# Patient Record
Sex: Female | Born: 1935 | Race: Black or African American | Hispanic: No | Marital: Married | Smoking: Never smoker
Health system: Southern US, Community
[De-identification: ages and names within clinical notes are randomized; demographics above are authoritative.]

## PROBLEM LIST (undated history)

## (undated) DIAGNOSIS — I10 Essential (primary) hypertension: Secondary | ICD-10-CM

## (undated) DIAGNOSIS — I671 Cerebral aneurysm, nonruptured: Secondary | ICD-10-CM

## (undated) DIAGNOSIS — E7800 Pure hypercholesterolemia, unspecified: Secondary | ICD-10-CM

## (undated) DIAGNOSIS — I422 Other hypertrophic cardiomyopathy: Secondary | ICD-10-CM

## (undated) DIAGNOSIS — I480 Paroxysmal atrial fibrillation: Principal | ICD-10-CM

## (undated) DIAGNOSIS — H269 Unspecified cataract: Secondary | ICD-10-CM

## (undated) DIAGNOSIS — M1990 Unspecified osteoarthritis, unspecified site: Secondary | ICD-10-CM

## (undated) DIAGNOSIS — I351 Nonrheumatic aortic (valve) insufficiency: Secondary | ICD-10-CM

## (undated) HISTORY — DX: Pure hypercholesterolemia, unspecified: E78.00

## (undated) HISTORY — DX: Unspecified osteoarthritis, unspecified site: M19.90

## (undated) HISTORY — DX: Essential (primary) hypertension: I10

## (undated) HISTORY — DX: Cerebral aneurysm, nonruptured: I67.1

## (undated) HISTORY — DX: Other hypertrophic cardiomyopathy: I42.2

## (undated) HISTORY — DX: Unspecified cataract: H26.9

## (undated) HISTORY — DX: Nonrheumatic aortic (valve) insufficiency: I35.1

## (undated) HISTORY — DX: Paroxysmal atrial fibrillation: I48.0

---

## 1999-01-02 ENCOUNTER — Other Ambulatory Visit: Admission: RE | Admit: 1999-01-02 | Discharge: 1999-01-02 | Payer: Self-pay | Admitting: Internal Medicine

## 1999-02-16 ENCOUNTER — Ambulatory Visit (HOSPITAL_COMMUNITY): Admission: RE | Admit: 1999-02-16 | Discharge: 1999-02-16 | Payer: Self-pay | Admitting: Internal Medicine

## 1999-02-21 ENCOUNTER — Encounter: Admission: RE | Admit: 1999-02-21 | Discharge: 1999-02-21 | Payer: Self-pay | Admitting: Internal Medicine

## 1999-02-21 ENCOUNTER — Encounter: Payer: Self-pay | Admitting: Internal Medicine

## 2000-02-23 ENCOUNTER — Encounter: Payer: Self-pay | Admitting: Internal Medicine

## 2000-02-23 ENCOUNTER — Encounter: Admission: RE | Admit: 2000-02-23 | Discharge: 2000-02-23 | Payer: Self-pay | Admitting: Internal Medicine

## 2001-03-07 ENCOUNTER — Encounter: Admission: RE | Admit: 2001-03-07 | Discharge: 2001-03-07 | Payer: Self-pay | Admitting: Internal Medicine

## 2001-03-07 ENCOUNTER — Encounter: Payer: Self-pay | Admitting: Internal Medicine

## 2001-12-10 ENCOUNTER — Encounter: Payer: Self-pay | Admitting: Internal Medicine

## 2001-12-10 ENCOUNTER — Encounter: Admission: RE | Admit: 2001-12-10 | Discharge: 2001-12-10 | Payer: Self-pay | Admitting: Internal Medicine

## 2002-03-17 ENCOUNTER — Encounter: Admission: RE | Admit: 2002-03-17 | Discharge: 2002-03-17 | Payer: Self-pay | Admitting: Internal Medicine

## 2002-03-17 ENCOUNTER — Encounter: Payer: Self-pay | Admitting: Internal Medicine

## 2003-03-23 ENCOUNTER — Encounter: Admission: RE | Admit: 2003-03-23 | Discharge: 2003-03-23 | Payer: Self-pay | Admitting: Internal Medicine

## 2004-03-16 ENCOUNTER — Other Ambulatory Visit: Admission: RE | Admit: 2004-03-16 | Discharge: 2004-03-16 | Payer: Self-pay | Admitting: Family Medicine

## 2004-08-14 ENCOUNTER — Encounter: Admission: RE | Admit: 2004-08-14 | Discharge: 2004-08-14 | Payer: Self-pay | Admitting: Family Medicine

## 2005-07-25 ENCOUNTER — Encounter: Admission: RE | Admit: 2005-07-25 | Discharge: 2005-07-25 | Payer: Self-pay | Admitting: Family Medicine

## 2005-08-31 ENCOUNTER — Ambulatory Visit (HOSPITAL_COMMUNITY): Admission: RE | Admit: 2005-08-31 | Discharge: 2005-08-31 | Payer: Self-pay | Admitting: Family Medicine

## 2005-10-22 ENCOUNTER — Encounter (INDEPENDENT_AMBULATORY_CARE_PROVIDER_SITE_OTHER): Payer: Self-pay | Admitting: Specialist

## 2005-10-22 ENCOUNTER — Ambulatory Visit (HOSPITAL_COMMUNITY): Admission: RE | Admit: 2005-10-22 | Discharge: 2005-10-22 | Payer: Self-pay | Admitting: Gastroenterology

## 2006-08-30 ENCOUNTER — Encounter: Admission: RE | Admit: 2006-08-30 | Discharge: 2006-08-30 | Payer: Self-pay | Admitting: Internal Medicine

## 2007-09-12 ENCOUNTER — Encounter: Admission: RE | Admit: 2007-09-12 | Discharge: 2007-09-12 | Payer: Self-pay | Admitting: Internal Medicine

## 2009-03-10 ENCOUNTER — Encounter: Admission: RE | Admit: 2009-03-10 | Discharge: 2009-03-10 | Payer: Self-pay | Admitting: Internal Medicine

## 2010-03-22 ENCOUNTER — Other Ambulatory Visit: Payer: Self-pay | Admitting: Internal Medicine

## 2010-03-22 DIAGNOSIS — Z1231 Encounter for screening mammogram for malignant neoplasm of breast: Secondary | ICD-10-CM

## 2010-03-28 ENCOUNTER — Ambulatory Visit
Admission: RE | Admit: 2010-03-28 | Discharge: 2010-03-28 | Disposition: A | Discharge status: Home / Self Care | Payer: Medicare Other | Source: Ambulatory Visit | Attending: Internal Medicine | Admitting: Internal Medicine

## 2010-03-28 DIAGNOSIS — Z1231 Encounter for screening mammogram for malignant neoplasm of breast: Secondary | ICD-10-CM

## 2010-04-03 ENCOUNTER — Other Ambulatory Visit: Payer: Self-pay | Admitting: Internal Medicine

## 2010-04-03 DIAGNOSIS — R928 Other abnormal and inconclusive findings on diagnostic imaging of breast: Secondary | ICD-10-CM

## 2010-04-10 ENCOUNTER — Ambulatory Visit
Admission: RE | Admit: 2010-04-10 | Discharge: 2010-04-10 | Disposition: A | Discharge status: Home / Self Care | Payer: Medicare Other | Source: Ambulatory Visit | Attending: Internal Medicine | Admitting: Internal Medicine

## 2010-04-10 DIAGNOSIS — R928 Other abnormal and inconclusive findings on diagnostic imaging of breast: Secondary | ICD-10-CM

## 2010-06-30 NOTE — Op Note (Signed)
NAME:  Courtney Reyes, Courtney Reyes                ACCOUNT NO.:  [id number]   MEDICAL RECORD NO.:  [id number]          PATIENT TYPE:  AMB   LOCATION:  ENDO                         FACILITY:  [facility]   PHYSICIAN:  Anselmo Rod, M.D.  DATE OF BIRTH:  1924/04/30   DATE OF PROCEDURE:  10/22/2005  DATE OF DISCHARGE:                                 OPERATIVE REPORT   PROCEDURE:  Colonoscopy with snare polypectomy x1, cold biopsies x2.   ENDOSCOPIST:  Anselmo Rod, M.D.   INSTRUMENT USED:  Olympus videocolonoscope.   INDICATIONS FOR PROCEDURE:  The patient is a 77 year old African American  female undergoing screening colonoscopy to rule out colonic polyps, masses,  etc.   PREPROCEDURE PREPARATION:  Informed consent was procured from the patient.  The patient was fasted for four hours prior to the procedure and prepped  with 20 OsmoPrep pills the night of and 12 OsmoPrep pills the morning of the  procedure.  The risks and benefits of the procedure, including a 10% missed  rate of cancer and polyps were discussed with her as well.   PREPROCEDURE PHYSICAL EXAMINATION:  VITAL SIGNS:  Stable.  NECK:  Supple.  CHEST:  Clear to auscultation.  S1 and S2 regular.  ABDOMEN:  Soft with normal bowel sounds.   DESCRIPTION OF PROCEDURE:  The patient was placed in the left lateral  decubitus position and sedated with 40 mcg of Fentanyl and 4 mg of Versed in  slow incremental doses given intravenously.  Once the patient was adequately  sedated and maintained on low-flow oxygen and continuous cardiac monitoring,  the Olympus videocolonoscope was advanced from the rectum to the cecum.  There was some residual stool in the colon.  Multiple washings were done.  A  small sessile polyp was removed by core biopsy (cold biopsies x2) from the  proximal right colon.  Another flat polyp was removed by hot snare from 65  cm.  A few sigmoid diverticula were noted.  Internal hemorrhoids were  appreciated on retroflexion in  the rectum.  The terminal ileum appeared  healthy and without lesions.  The patient tolerated the procedure well  without immediate complications.   IMPRESSION:  1. Small nonbleeding internal hemorrhoids.  2. Sigmoid diverticulosis.  3. One small flat polyp removed by hot snare from 65 cm.  4. Another sessile polyp removed by cold biopsy from the proximal right      colon.  5. Normal terminal ileum.   RECOMMENDATIONS:  1. Await pathology results.  2. Avoid all nonsteroidals, including aspirin, for the next two weeks.  3. Brochures on diverticulosis and high fiber diet have been given to the      patient for education.  4. Outpatient followup as need arises in the future.      Anselmo Rod, M.D.  Electronically Signed     JNM/MEDQ  D:  10/22/2005  T:  10/22/2005  Job:  [id number]   cc:   Renaye Rakers, M.D.

## 2011-05-07 ENCOUNTER — Other Ambulatory Visit: Payer: Self-pay | Admitting: Internal Medicine

## 2011-05-07 DIAGNOSIS — Z1231 Encounter for screening mammogram for malignant neoplasm of breast: Secondary | ICD-10-CM

## 2011-05-22 ENCOUNTER — Ambulatory Visit
Admission: RE | Admit: 2011-05-22 | Discharge: 2011-05-22 | Disposition: A | Discharge status: Home / Self Care | Payer: Medicare Other | Source: Ambulatory Visit | Attending: Internal Medicine | Admitting: Internal Medicine

## 2011-05-22 DIAGNOSIS — Z1231 Encounter for screening mammogram for malignant neoplasm of breast: Secondary | ICD-10-CM

## 2015-01-19 ENCOUNTER — Other Ambulatory Visit (HOSPITAL_COMMUNITY): Payer: Self-pay | Admitting: Internal Medicine

## 2015-01-19 DIAGNOSIS — I499 Cardiac arrhythmia, unspecified: Secondary | ICD-10-CM

## 2015-02-02 ENCOUNTER — Other Ambulatory Visit: Payer: Self-pay

## 2015-02-02 ENCOUNTER — Ambulatory Visit (HOSPITAL_COMMUNITY): Payer: Medicare Other | Attending: Cardiology

## 2015-02-02 DIAGNOSIS — I10 Essential (primary) hypertension: Secondary | ICD-10-CM | POA: Diagnosis not present

## 2015-02-02 DIAGNOSIS — I499 Cardiac arrhythmia, unspecified: Secondary | ICD-10-CM | POA: Insufficient documentation

## 2015-03-03 ENCOUNTER — Encounter: Payer: Self-pay | Admitting: Interventional Cardiology

## 2015-03-15 DIAGNOSIS — R001 Bradycardia, unspecified: Secondary | ICD-10-CM | POA: Insufficient documentation

## 2015-03-16 ENCOUNTER — Ambulatory Visit (INDEPENDENT_AMBULATORY_CARE_PROVIDER_SITE_OTHER): Payer: Medicare Other | Admitting: Interventional Cardiology

## 2015-03-16 ENCOUNTER — Encounter: Payer: Self-pay | Admitting: Interventional Cardiology

## 2015-03-16 DIAGNOSIS — I422 Other hypertrophic cardiomyopathy: Secondary | ICD-10-CM | POA: Diagnosis not present

## 2015-03-16 DIAGNOSIS — I480 Paroxysmal atrial fibrillation: Secondary | ICD-10-CM | POA: Diagnosis not present

## 2015-03-16 DIAGNOSIS — I351 Nonrheumatic aortic (valve) insufficiency: Secondary | ICD-10-CM | POA: Diagnosis not present

## 2015-03-16 HISTORY — DX: Nonrheumatic aortic (valve) insufficiency: I35.1

## 2015-03-16 HISTORY — DX: Other hypertrophic cardiomyopathy: I42.2

## 2015-03-16 NOTE — Progress Notes (Signed)
Cardiology Office Note   Date:  03/16/2015   ID:  Courtney Reyes, DOB Feb 26, 1924, MRN [id number]  PCP:  Katy Apo, MD  Cardiologist:  Lesleigh Noe, MD   Chief Complaint  Patient presents with  . Bradycardia      History of Present Illness: Courtney Reyes is a 82 y.o. female who presents for evaluation of paroxysmal atrial fibrillation (EKG diagnosis), severe aortic regurgitation, an apical hypertrophic cardiomyopathy.  Courtney Reyes is a very pleasant 82 year old female whom I have known for quite some time. I was her husband's cardiologist. She is now referred by Dr. polite because of concern about atrial fibrillation noted on EKG in December 2016. The patient was asymptomatic. She remains asymptomatic. An echocardiogram was performed and demonstrated apical hypertrophic cardiomyopathy as well as moderate to severe aortic regurgitation. She denies chest pain, orthopnea, edema, syncope, palpitations, transient neurological symptoms, and history of heart disease. He is accompanied by her daughter who helps with history.    Past Medical History  Diagnosis Date  . Hypertension   . Hypercholesteremia   . Osteoarthritis   . Cataracts, bilateral   . Cerebral aneurysm     No past surgical history on file.   Current Outpatient Prescriptions  Medication Sig Dispense Refill  . aspirin 325 MG tablet Take 325 mg by mouth daily.    Marland Kitchen atenolol (TENORMIN) 100 MG tablet Take 100 mg by mouth daily.    . calcium-vitamin D (OSCAL 500/200 D-3) 500-200 MG-UNIT tablet Take 1 tablet by mouth daily with breakfast.    . chlorthalidone (HYGROTON) 25 MG tablet Take 25 mg by mouth daily as needed (elevated BP or swelling).     . dorzolamide-timolol (COSOPT) 22.3-6.8 MG/ML ophthalmic solution Place 1 drop into both eyes 2 (two) times daily.  11  . latanoprost (XALATAN) 0.005 % ophthalmic solution Place 1 drop into both eyes daily.  11  . meloxicam (MOBIC) 7.5 MG tablet Take 7.5 mg by mouth  daily.    . potassium chloride (K-DUR) 10 MEQ tablet Take 10 mEq by mouth 2 (two) times daily.    . Red Yeast Rice 600 MG CAPS Take 600 mg by mouth daily.     No current facility-administered medications for this visit.    Allergies:   Aleve    Social History:  The patient  reports that she has never smoked. She has never used smokeless tobacco. She reports that she does not drink alcohol or use illicit drugs.   Family History:  The patient's family history includes Healthy in her father and mother.    ROS:  Please see the history of present illness.   Otherwise, review of systems are positive for leg pain, skipped heartbeats, irregular heartbeat, vision disturbance, back pain, difficulty with balance..   All other systems are reviewed and negative.    PHYSICAL EXAM: VS:  BP 140/76 mmHg  Pulse 50  Ht  (1.6 m)  Wt 127 lb 12.8 oz (57.97 kg)  BMI 22.64 kg/m2 , BMI Body mass index is 22.64 kg/(m^2). GEN: Well nourished, well developed, in no acute distress HEENT: normal Neck: no JVD, carotid bruits, or masses Cardiac: RRR.  There is a grade 2 to 3/6 decrescendo high-pitched holodiastolic murmur of aortic regurgitation.  There is no rub but an S4 gallop. There is trace bilateral edema. Respiratory:  clear to auscultation bilaterally, normal work of breathing. GI: soft, nontender, nondistended, + BS MS: no deformity or atrophy Skin: warm and dry,  no rash Neuro:  Strength and sensation are intact Psych: euthymic mood, full affect   EKG:  EKG is ordered today. The ekg reveals sinus bradycardia 50 bpm, first-degree AV block, left anterior hemiblock, left ventricular hypertrophy, marked precordial and inferolateral T-wave inversion.   Recent Labs: No results found for requested labs within last 365 days.    Lipid Panel No results found for: CHOL, TRIG, HDL, CHOLHDL, VLDL, LDLCALC, LDLDIRECT    Wt Readings from Last 3 Encounters:  03/16/15 127 lb 12.8 oz (57.97 kg)       Other studies Reviewed: Additional studies/ records that were reviewed today include: Reviewed records from [facility] physicians and Dr. Renford Dills. The findings include a chart diagnosis of atrial fibrillation but we do not have the EKG. Have also reviewed the echocardiogram performed at [facility] which demonstrates apical hypertrophy    ASSESSMENT AND PLAN:  1. PAF(paroxysmal atrial fibrillation) Based upon records from Dr. polite  2. Aortic regurgitation, severe by echo Based upon recent echo performed here. She is asymptomatic and denies dyspnea. Echo does not reveal any evidence of LV enlargement.  3. Apical variant hypertrophic cardiomyopathy (HCC), documented by echo 2017 Based upon recent echo performed here, asymptomatic.    Current medicines are reviewed at length with the patient today.  The patient has the following concerns regarding medicines: none.  The following changes/actions have been instituted:    Confirm atrial fibrillation diagnosis by obtaining the EKG from [facility]  Thirty-day continuous ambulatory monitor We discussed the pros and cons of anticoagulation therapy.This patients CHA2DS2-VASc Score and unadjusted Ischemic Stroke Rate (% per year) is equal to 4.8 % stroke rate/year from a score of 4 Above score calculated as 1 point each if present [CHF, HTN, DM, Vascular=MI/PAD/Aortic Plaque, Age if 65-74, or Female] Above score calculated as 2 points each if present [Age > 75, or Stroke/TIA/TE] Further discussion after more information is available.  Labs/ tests ordered today include:  No orders of the defined types were placed in this encounter.     Disposition:   FU with HS in 1 month if we confirm atrial fibrillation he is on monitor or EKG from [place], Lesleigh Noe, MD  03/16/2015 3:12 PM    [facility] [address] [place], [place], [place]  [address] Phone: [phone]; Fax: [phone]

## 2015-03-16 NOTE — Patient Instructions (Signed)
Medication Instructions:  Your physician recommends that you continue on your current medications as directed. Please refer to the Current Medication list given to you today.   Labwork: None ordered  Testing/Procedures: Your physician has recommended that you wear an event monitor. Event monitors are medical devices that record the heart's electrical activity. Doctors most often [place] these monitors to diagnose arrhythmias. Arrhythmias are problems with the speed or rhythm of the heartbeat. The monitor is a small, portable device. You can wear one while you do your normal daily activities. This is usually used to diagnose what is causing palpitations/syncope (passing out).    Follow-Up: Your physician recommends that you schedule a follow-up appointment as needed   Any Other Special Instructions Will Be Listed Below (If Applicable).     If you need a refill on your cardiac medications before your next appointment, please call your pharmacy.

## 2015-03-17 ENCOUNTER — Ambulatory Visit (INDEPENDENT_AMBULATORY_CARE_PROVIDER_SITE_OTHER): Payer: Medicare Other

## 2015-03-17 DIAGNOSIS — I480 Paroxysmal atrial fibrillation: Secondary | ICD-10-CM | POA: Diagnosis not present

## 2015-03-18 ENCOUNTER — Telehealth: Payer: Self-pay | Admitting: *Deleted

## 2015-03-18 NOTE — Telephone Encounter (Signed)
On Call Cardiology   Jamie from [employer] called [phone]. Apparently patient had a post-conversion pause of 3.2 sec (from AF to NSR). After conversion, initial sinus rate was 45 bpm. Patient was sleeping at the time and no symptoms reported.   Given her high CHA2DS2-VASc score (at least 4 =  age above 18, gender, HTN), oral anticoagulation would be recommended if no contraindications.   Will forward this message to Dr. Verdis Prime.   Nevin Bloodgood, MD

## 2015-03-18 NOTE — Telephone Encounter (Signed)
Pt in a fib on monitor HR around 100 - we have not been able to contact.  But do you want to add anticoagulation? Which.  thanks

## 2015-03-18 NOTE — Telephone Encounter (Signed)
Received [employer] monitor alert for patient who had monitor applied yesterday. Episode is from 03/17/15 at 7:11pm HR of 100 BPM, prelim findings of "atrial flutter, wide complex beats"; call attempt to patient unsuccessful per report.  Called and left voice mail at this time for patient to call back. Reviewed with [employer], Nada Boozer, who will review with Dr. Katrinka Blazing

## 2015-03-19 NOTE — Telephone Encounter (Signed)
Please notify patient that we have discovered atrial fibrillation on the monitor. Will need a follow up OV to discuss management going forward. Needs Echo to assess LV if we have not already scheduled.

## 2015-03-21 NOTE — Telephone Encounter (Signed)
Spoke with pt and her daughter. Scheduled pt to see Robbie Lis, PA-C on 2/8 at 11AM. Informed Nada Boozer, NP that pt had echo on 12/21. She said no need to repeat at this time.

## 2015-03-21 NOTE — Telephone Encounter (Signed)
Left message to call back  

## 2015-03-23 ENCOUNTER — Encounter: Payer: Self-pay | Admitting: Cardiology

## 2015-03-23 ENCOUNTER — Ambulatory Visit (INDEPENDENT_AMBULATORY_CARE_PROVIDER_SITE_OTHER): Payer: Medicare Other | Admitting: Cardiology

## 2015-03-23 VITALS — BP 130/70 | HR 53 | Ht 63.0 in | Wt 129.8 lb

## 2015-03-23 DIAGNOSIS — I422 Other hypertrophic cardiomyopathy: Secondary | ICD-10-CM

## 2015-03-23 DIAGNOSIS — I351 Nonrheumatic aortic (valve) insufficiency: Secondary | ICD-10-CM

## 2015-03-23 DIAGNOSIS — I480 Paroxysmal atrial fibrillation: Secondary | ICD-10-CM | POA: Insufficient documentation

## 2015-03-23 HISTORY — DX: Paroxysmal atrial fibrillation: I48.0

## 2015-03-23 LAB — CBC WITH DIFFERENTIAL/PLATELET
BASOS ABS: 0 10*3/uL (ref 0.0–0.1)
Basophils Relative: 0 % (ref 0–1)
EOS ABS: 0.1 10*3/uL (ref 0.0–0.7)
Eosinophils Relative: 1 % (ref 0–5)
HEMATOCRIT: 42 % (ref 36.0–46.0)
HEMOGLOBIN: 13.7 g/dL (ref 12.0–15.0)
LYMPHS ABS: 2.5 10*3/uL (ref 0.7–4.0)
LYMPHS PCT: 46 % (ref 12–46)
MCH: 31.6 pg (ref 26.0–34.0)
MCHC: 32.6 g/dL (ref 30.0–36.0)
MCV: 97 fL (ref 78.0–100.0)
MONOS PCT: 7 % (ref 3–12)
MPV: 10.1 fL (ref 8.6–12.4)
Monocytes Absolute: 0.4 10*3/uL (ref 0.1–1.0)
NEUTROS ABS: 2.5 10*3/uL (ref 1.7–7.7)
NEUTROS PCT: 46 % (ref 43–77)
PLATELETS: 223 10*3/uL (ref 150–400)
RBC: 4.33 MIL/uL (ref 3.87–5.11)
RDW: 13.9 % (ref 11.5–15.5)
WBC: 5.5 10*3/uL (ref 4.0–10.5)

## 2015-03-23 LAB — BASIC METABOLIC PANEL
BUN: 22 mg/dL (ref 7–25)
CALCIUM: 9.8 mg/dL (ref 8.6–10.4)
CHLORIDE: 103 mmol/L (ref 98–110)
CO2: 27 mmol/L (ref 20–31)
CREATININE: 1.09 mg/dL — AB (ref 0.60–0.88)
Glucose, Bld: 102 mg/dL — ABNORMAL HIGH (ref 65–99)
Potassium: 4.3 mmol/L (ref 3.5–5.3)
Sodium: 140 mmol/L (ref 135–146)

## 2015-03-23 LAB — TSH: TSH: 1.34 mIU/L

## 2015-03-23 MED ORDER — ACETAMINOPHEN ER 650 MG PO TBCR: 650.0000 mg | EXTENDED_RELEASE_TABLET | Freq: Three times a day (TID) | ORAL | Status: AC | PRN

## 2015-03-23 MED ORDER — APIXABAN 2.5 MG PO TABS: 2.5000 mg | ORAL_TABLET | Freq: Two times a day (BID) | ORAL | Status: DC

## 2015-03-23 NOTE — Patient Instructions (Addendum)
Medication Instructions:  Your physician has recommended you make the following change in your medication:  1.  STOP the Aspirin 2.  START the Eliquis 2.5 mg taking 1 tablet twice a day 3.  STOP the Meloxicam 4.  START the Tylenol 650 mg taking 1 tablet daily as needed for pain   Labwork: TODAY:  CBC W/DIFF                TSH                BMET  Testing/Procedures: None ordered  Follow-Up: Your physician recommends that you schedule a follow-up appointment WITH DR. Katrinka Blazing AT HIS 1ST AVAILABLE    Any Other Special Instructions Will Be Listed Below (If Applicable).   If you need a refill on your cardiac medications before your next appointment, please call your pharmacy.

## 2015-03-23 NOTE — Progress Notes (Signed)
03/23/2015 Courtney Reyes   1924-06-20  [id number]  Primary Physician Katy Apo, MD Primary Cardiologist: Dr. Katrinka Blazing   Reason for Visit/CC: F/u for PAF  HPI:  82 y/o female, with h/o HTN, HLD, who was recently referred to Dr. Katrinka Blazing, by her PCP, given concerns for PAF. Patient was seen by Dr. Katrinka Blazing on 03/16/15 and he ordered a 30 day cardiac monitor to assess for atrial fibrillation to help determine if she would need anticoagulation to reduce stroke risk. Patient was fitted with monitor and was noted to be in atrial fibrillation on 03/18/15. She was observed to be in a RVR in the 150s. She was notified by the monitoring station. Her daugher checked her pulse at home and noted it was irregular and fast. The patient denies any symptoms at all during the time. Her daughter gave her an additional atenolol tablet and her pulse rate improved. The patient was instructed to f/u in clinic for further assessment and to discuss anticoagulation options. It should also be noted that Dr. Katrinka Blazing ordred an Echo which showed normal LVEF but did demonstrate apical hypertrophic cardiomyopathy as well as moderate to severe aortic regurgitation. Given her lack of symptoms, Dr. Katrinka Blazing noted that there is no need for intervention at this time.   She presents back to clinic today for f/u. She is accompanied by her daughter. EKG today now shows sinus bradycardia with a rate of 53 bpm. She reports that she feels fine. She denies any symptoms of fatigue. No dizziness, syncope/ near syncope. She has been completely asymptomatic regarding her arrhthymia. She denies palpitations, dyspnea, exertional dyspnea and chest pain. Her BP is well controlled. Her CHA2DS2 VASc score is 4 for Age >19, female sex and HTN.  She denies prior h/o Stroke/TIA. No h/o abnormal bleeding of falls.      Current Outpatient Prescriptions  Medication Sig Dispense Refill  . aspirin 325 MG tablet Take 325 mg by mouth daily.    Marland Kitchen atenolol (TENORMIN) 100  MG tablet Take 100 mg by mouth daily.    . calcium-vitamin D (OSCAL 500/200 D-3) 500-200 MG-UNIT tablet Take 1 tablet by mouth daily with breakfast.    . chlorthalidone (HYGROTON) 25 MG tablet Take 25 mg by mouth daily as needed (elevated BP or swelling).     . dorzolamide-timolol (COSOPT) 22.3-6.8 MG/ML ophthalmic solution Place 1 drop into both eyes 2 (two) times daily.  11  . latanoprost (XALATAN) 0.005 % ophthalmic solution Place 1 drop into both eyes daily.  11  . meloxicam (MOBIC) 7.5 MG tablet Take 7.5 mg by mouth daily.    . potassium chloride (K-DUR) 10 MEQ tablet Take 10 mEq by mouth 2 (two) times daily.    . Red Yeast Rice 600 MG CAPS Take 600 mg by mouth daily.     No current facility-administered medications for this visit.    Allergies  Allergen Reactions  . Aleve [Naproxen Sodium] Hives    Social History   Social History  . Marital Status: Married    Spouse Name: N/A  . Number of Children: N/A  . Years of Education: N/A   Occupational History  . Not on file.   Social History Main Topics  . Smoking status: Never Smoker   . Smokeless tobacco: Never Used  . Alcohol Use: No  . Drug Use: No  . Sexual Activity: Not on file   Other Topics Concern  . Not on file   Social History Narrative  Review of Systems: General: negative for chills, fever, night sweats or weight changes.  Cardiovascular: negative for chest pain, dyspnea on exertion, edema, orthopnea, palpitations, paroxysmal nocturnal dyspnea or shortness of breath Dermatological: negative for rash Respiratory: negative for cough or wheezing Urologic: negative for hematuria Abdominal: negative for nausea, vomiting, diarrhea, bright red blood per rectum, melena, or hematemesis Neurologic: negative for visual changes, syncope, or dizziness All other systems reviewed and are otherwise negative except as noted above.    Blood pressure 130/70, pulse 53, height  (1.6 m), weight 129 lb 12.8 oz (58.877  kg).  General appearance: alert, cooperative and no distress Neck: no carotid bruit and no JVD Lungs: clear to auscultation bilaterally Heart: regular rate and rhythm and 2/6 DM Extremities: no LEE Pulses: 2+ and symmetric Skin: warm and dry Neurologic: Grossly normal  EKG sinus bradycardia 53 bpm   ASSESSMENT AND PLAN:  1. PAF: out patient cardiac monitoring confirmed the diagnosis of PAF. EKG today shows that she is back in NSR. HR is well controlled on atenolol. BP is stable. She has been completely asymptomatic with her afib episdodes. Recent 2D echo showed normal LV systolic function but apical hypertrophic cardiomyopathy as well as moderate to severe aortic regurgitation. No intrevention indicated given she is asymtomatic. Her CHA2DS2 VASc score is 4 for Age >83, female sex and HTN. We discussed the pros and cons of anticoagulation therapy.This patients CHA2DS2-VASc Score and unadjusted Ischemic Stroke Rate (% per year) is equal to 4.8 % stroke rate/year from a score of 4 Above score calculated as 1 point each if present [CHF, HTN, DM, Vascular=MI/PAD/Aortic Plaque, Age if 65-74, or Female] Above score calculated as 2 points each if present [Age > 75, or Stroke/TIA/TE].  I have also discussed with Dr. Katrinka Blazing who has recommended anticoagulation for stroke prophylasixs. The patient has decided to start anticoagulation. We will initiate 2.5 mg of Eliquis BID. We will check a CBC and BMP for baseline H/H and renal function. Patient instructed to stop ASA (no h/o CAD). She was also advised to stop daily use of Meloxicam to reduce risk for GIB. Daily Tylenol recommended for her chronic LBP. 30 day free Eliqius card was given.      PLAN  F/u with Dr. Katrinka Blazing in 6-8 weeks for reassessment.   Robbie Lis PA-C 03/23/2015 11:24 AM

## 2015-03-30 ENCOUNTER — Telehealth: Payer: Self-pay | Admitting: Interventional Cardiology

## 2015-03-30 NOTE — Telephone Encounter (Signed)
Returned pt daughter [name]'scall. Pt was started on Eliqyis for new onset afib She sts that pt monthly supply of Eliquis would cost over $200 a month. Pt was given a card for a free 30day supply of Eliquis. Pt pharmacy will be fwd a tier exception form to be completed by our office. [name] sts that pt is doing ok, pt has a f/u appt with Dr.Smith in April. [name] to call the office if pt develops symptoms, has bleeding, or falls. [name] that I will leave 2 wks worth of samples at the front desk for her to pick up. [name] voiced appreciation for the call back and verbalized understanding.

## 2015-03-30 NOTE — Telephone Encounter (Signed)
New message ° ° ° ° ° °Talk to the nurse.  She would not tell me what she wanted °

## 2015-04-05 ENCOUNTER — Ambulatory Visit: Payer: Medicare Other | Admitting: Cardiology

## 2015-04-07 ENCOUNTER — Telehealth: Payer: Self-pay | Admitting: Interventional Cardiology

## 2015-04-07 NOTE — Telephone Encounter (Signed)
Patient calling the office for samples of medication:   1.  What medication and dosage are you requesting samples for? Eliquis   2.  Are you currently out of this medication? No     

## 2015-04-07 NOTE — Telephone Encounter (Signed)
Called patient to let her know that we had samples available for pick up.

## 2015-04-08 ENCOUNTER — Telehealth: Payer: Self-pay | Admitting: Interventional Cardiology

## 2015-04-08 NOTE — Telephone Encounter (Signed)
Follow up      Calling to see what the doctor said about her "fuzzy" vision

## 2015-04-08 NOTE — Telephone Encounter (Signed)
New message      Pt c/o medication issue:  1. Name of Medication:  eliquis 2. How are you currently taking this medication (dosage and times per day)?  2.5mg  daily 3. Are you having a reaction (difficulty breathing--STAT)? no  4. What is your medication issue?  Pt states her vision is "fuzzy".  Pt started medication on 03-23-15.  For a few days she took it twice a day, then started taking it once a day because she would forget to take it twice.  Could this be the reason her vision is fuzzy.

## 2015-04-08 NOTE — Telephone Encounter (Signed)
dont think fuzzy vision should be an issue

## 2015-04-08 NOTE — Telephone Encounter (Signed)
I don't think there is a correlation between vision and Eliquis. Needs to see Ophthalmologist.

## 2015-04-08 NOTE — Telephone Encounter (Signed)
**Note De-Identified Earle Troiano Obfuscation** The pts daughter is advised of Dr Ricki Miller response. I also advised her to make sure that the pt is taking her Eliquis BID as directed and to contact the pts PCP and/or eye MD if "fuzzy" vision does not improve over the weekend . Also I discussed the s/s to look for for Stroke prevention and to call 911 if the pt exhibits any. She verbalized understanding to all instructions given and thanked me for my assistance.

## 2015-04-08 NOTE — Telephone Encounter (Signed)
Routed original message to triage

## 2015-04-08 NOTE — Telephone Encounter (Signed)
**Note De-Identified Gay Rape Obfuscation** The pt is also advised of Dr Lonn Georgia recommendation. She verbalized understanding and thanked me for calling her back with Dr Lonn Georgia response as she states that the pt will feel better knowing that Dr Katrinka Blazing is aware.

## 2015-04-08 NOTE — Telephone Encounter (Signed)
See attached message:  . Name of Medication: eliquis 2. How are you currently taking this medication (dosage and times per day)? 2.5mg  daily 3. Are you having a reaction (difficulty breathing--STAT)? no  4. What is your medication issue? Pt states her vision is "fuzzy". Pt started medication on 03-23-15. For a few days she took it twice a day, then started taking it once a day because she would forget to take it twice. Could this be the reason her vision is fuzzy  Daughter states that she doesn't know her BP or HR She does not have issues with diabetes or blood sugar issues No weakness of arms or legs Has glaucoma but she feels the fuzzy vision is worse than with her glaucoma  Routed to Audrie Lia and Margaretmary Dys, pharmacist Routing to Dr. Katrinka Blazing Routing to DOD Dr. Eden Emms

## 2015-04-27 ENCOUNTER — Telehealth: Payer: Self-pay

## 2015-04-27 NOTE — Telephone Encounter (Signed)
Prior auth for Eliquis 2.5mg submitted to [facility]. 

## 2015-04-29 ENCOUNTER — Telehealth: Payer: Self-pay

## 2015-04-29 NOTE — Telephone Encounter (Signed)
Eliquis approved through 02/12/2016. PA- [id number].

## 2015-05-04 ENCOUNTER — Telehealth: Payer: Self-pay

## 2015-05-04 NOTE — Telephone Encounter (Signed)
Called to give pt her cardiac event monitor results. lmtcb

## 2015-05-04 NOTE — Telephone Encounter (Signed)
Follow up ° ° ° ° ° °Returning a call to the nurse °

## 2015-05-05 ENCOUNTER — Telehealth: Payer: Self-pay | Admitting: Interventional Cardiology

## 2015-05-05 NOTE — Telephone Encounter (Signed)
Pt daughter Fulton Molelice aware of pt's monitor results with verbal understanding. Frequent atrial fib and atrial flutter with occasional SB and post conversion pauses. 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 This could eventually require pacemaker. Clinical observation for now. If any dizzy or syncopal episodes, please report. Pt will f/u as planned with Dr.Smith in April 2017

## 2015-05-05 NOTE — Telephone Encounter (Signed)
Follow up    Daughter calling forgot to ask about the eliquis.

## 2015-05-05 NOTE — Telephone Encounter (Signed)
Follow up ° ° ° ° ° °Returning a call to the nurse °

## 2015-05-05 NOTE — Telephone Encounter (Signed)
Returned pt daughter [name]'s call. She has a question about the pt Eliquis tier exception she will call back to speak with Linda,LPN. [facility]

## 2015-05-05 NOTE — Telephone Encounter (Signed)
New message      Calling to get eliquis reduced to a tier 2 drug.  Please call when you return

## 2015-05-09 NOTE — Telephone Encounter (Signed)
Tier exception for Eliquis 2.5mg  [facility] to [facility].

## 2015-05-09 NOTE — Telephone Encounter (Signed)
Spoke with Fulton MoleAlice, patient's daughter. Have let her know we requested a tier exception today. Should know in a day or 2.

## 2015-05-24 ENCOUNTER — Telehealth: Payer: Self-pay

## 2015-05-24 NOTE — Telephone Encounter (Signed)
Tier exception for Eliquis is denied. Patient will  receive a letter to this affect.

## 2015-06-10 ENCOUNTER — Encounter: Payer: Self-pay | Admitting: Interventional Cardiology

## 2015-06-10 ENCOUNTER — Ambulatory Visit (INDEPENDENT_AMBULATORY_CARE_PROVIDER_SITE_OTHER): Payer: Medicare Other | Admitting: Interventional Cardiology

## 2015-06-10 VITALS — BP 146/94 | HR 88 | Ht 63.0 in | Wt 123.4 lb

## 2015-06-10 DIAGNOSIS — I351 Nonrheumatic aortic (valve) insufficiency: Secondary | ICD-10-CM

## 2015-06-10 DIAGNOSIS — Z7901 Long term (current) use of anticoagulants: Secondary | ICD-10-CM | POA: Diagnosis not present

## 2015-06-10 DIAGNOSIS — I422 Other hypertrophic cardiomyopathy: Secondary | ICD-10-CM | POA: Diagnosis not present

## 2015-06-10 DIAGNOSIS — I480 Paroxysmal atrial fibrillation: Secondary | ICD-10-CM

## 2015-06-10 NOTE — Patient Instructions (Signed)
Medication Instructions:  Your physician recommends that you continue on your current medications as directed. Please refer to the Current Medication list given to you today.  Courtney Reyes- Courtney Reyes will work on looking into Eliquis medication assistance. She should call you.  Labwork: None ordered  Testing/Procedures: None ordered  Follow-Up: Your physician wants you to follow-up in: 6 months with Dr. Katrinka BlazingSmith. You will receive a reminder letter in the mail two months in advance. If you don't receive a letter, please call our office to schedule the follow-up appointment.  If you need a refill on your cardiac medications before your next appointment, please call your pharmacy.  Thank you for choosing [facility]!!

## 2015-06-10 NOTE — Progress Notes (Signed)
Cardiology Office Note   Date:  06/10/2015   ID:  Courtney Reyes, DOB 03/30/1924, MRN [id number]  PCP:  Katy Apo, MD  Cardiologist:  Lesleigh Noe, MD   Chief Complaint  Patient presents with  . Atrial Fibrillation      History of Present Illness: Courtney Reyes is a 82 y.o. female who presents for Paroxysmal atrial arrhythmia, hypertension, hypertrophic cardiomyopathy, and aortic regurgitation.  No bleeding on anti-coagulation therapy. Unable to afford the medication and has therefore been taking one tablet per day instead of the prescribed to times per day apixaban 2.5 mg tablets. No bleeding or neurological complaints. She has not had syncope. She denies dyspnea.    Past Medical History  Diagnosis Date  . Hypertension   . Hypercholesteremia   . Osteoarthritis   . Cataracts, bilateral   . Cerebral aneurysm   . Aortic regurgitation 03/16/2015  . Apical variant hypertrophic cardiomyopathy (HCC) 03/16/2015  . PAF (paroxysmal atrial fibrillation) (HCC) 03/23/2015    No past surgical history on file.   Current Outpatient Prescriptions  Medication Sig Dispense Refill  . acetaminophen (TYLENOL 8 HOUR) 650 MG CR tablet Take 1 tablet (650 mg total) by mouth every 8 (eight) hours as needed for pain. 30 tablet 2  . apixaban (ELIQUIS) 2.5 MG TABS tablet Take 1 tablet (2.5 mg total) by mouth 2 (two) times daily. 180 tablet 1  . aspirin 325 MG tablet Take 325 mg by mouth daily.    Marland Kitchen atenolol (TENORMIN) 100 MG tablet Take 100 mg by mouth daily.    . calcium-vitamin D (OSCAL 500/200 D-3) 500-200 MG-UNIT tablet Take 1 tablet by mouth daily with breakfast.    . chlorthalidone (HYGROTON) 25 MG tablet Take 25 mg by mouth daily as needed (elevated BP or swelling).     . dorzolamide-timolol (COSOPT) 22.3-6.8 MG/ML ophthalmic solution Place 1 drop into both eyes 2 (two) times daily.  11  . latanoprost (XALATAN) 0.005 % ophthalmic solution Place 1 drop into both eyes daily.  11  .  potassium chloride (K-DUR) 10 MEQ tablet Take 10 mEq by mouth 2 (two) times daily.    . Red Yeast Rice 600 MG CAPS Take 600 mg by mouth daily.     No current facility-administered medications for this visit.    Allergies:   Aleve    Social History:  The patient  reports that she has never smoked. She has never used smokeless tobacco. She reports that she does not drink alcohol or use illicit drugs.   Family History:  The patient's family history includes Healthy in her father and mother.    ROS:  Please see the history of present illness.   Otherwise, review of systems are positive for insomnia.   All other systems are reviewed and negative.    PHYSICAL EXAM: VS:  BP 146/94 mmHg  Pulse 88  Ht  (1.6 m)  Wt 123 lb 6.4 oz (55.974 kg)  BMI 21.86 kg/m2 , BMI Body mass index is 21.86 kg/(m^2). GEN: Well nourished, well developed, in no acute distress HEENT: normal Neck: no JVD, carotid bruits, or masses Cardiac: RRR.  There is  no murmur, rub, or gallop. There is no edema. Respiratory:  clear to auscultation bilaterally, normal work of breathing. GI: soft, nontender, nondistended, + BS MS: no deformity or atrophy Skin: warm and dry, no rash Neuro:  Strength and sensation are intact Psych: euthymic mood, full affect   EKG:  EKG is  not ordered today.   Recent Labs: 03/23/2015: BUN 22; Creat 1.09*; Hemoglobin 13.7; Platelets 223; Potassium 4.3; Sodium 140; TSH 1.34    Lipid Panel No results found for: CHOL, TRIG, HDL, CHOLHDL, VLDL, LDLCALC, LDLDIRECT    Wt Readings from Last 3 Encounters:  06/10/15 123 lb 6.4 oz (55.974 kg)  03/23/15 129 lb 12.8 oz (58.877 kg)  03/16/15 127 lb 12.8 oz (57.97 kg)      Other studies Reviewed: Additional studies/ records that were reviewed today include: None. The findings include none.    ASSESSMENT AND PLAN:  1. PAF (paroxysmal atrial fibrillation) (HCC) Asymptomatic currently in a regular rhythm at 80 bpm  2. Aortic  regurgitation Mild by clinical exam  3. Apical variant hypertrophic cardiomyopathy (HCC) Asymptomatic  4. Chronic anticoagulation Apixaban 2.5 mg twice a day but the patient is only taking it once a day because of economic concerns.    Current medicines are reviewed at length with the patient today.  The patient has the following concerns regarding medicines: Unable to afford apixaban which calls over $250 per prescription.  The following changes/actions have been instituted:    We will attempt to get her enrolled in an assistance program  Discussed the ineffectiveness of once per day apixaban  Labs/ tests ordered today include:  No orders of the defined types were placed in this encounter.     Disposition:   FU with HS in 6 months  Signed, Lesleigh NoeSMITH III,Apryl Brymer W, MD  06/10/2015 10:22 AM    [facility] [address], [place], [place]  [address] Phone: [phone]; Fax: [phone]

## 2016-01-01 DIAGNOSIS — Z7901 Long term (current) use of anticoagulants: Secondary | ICD-10-CM | POA: Insufficient documentation

## 2016-01-02 ENCOUNTER — Ambulatory Visit (INDEPENDENT_AMBULATORY_CARE_PROVIDER_SITE_OTHER): Payer: Medicare Other | Admitting: Interventional Cardiology

## 2016-01-02 ENCOUNTER — Encounter (INDEPENDENT_AMBULATORY_CARE_PROVIDER_SITE_OTHER): Payer: Self-pay

## 2016-01-02 ENCOUNTER — Encounter: Payer: Self-pay | Admitting: Interventional Cardiology

## 2016-01-02 VITALS — BP 140/78 | HR 64 | Ht 64.0 in | Wt 130.8 lb

## 2016-01-02 DIAGNOSIS — Z7901 Long term (current) use of anticoagulants: Secondary | ICD-10-CM

## 2016-01-02 DIAGNOSIS — I480 Paroxysmal atrial fibrillation: Secondary | ICD-10-CM | POA: Diagnosis not present

## 2016-01-02 DIAGNOSIS — I422 Other hypertrophic cardiomyopathy: Secondary | ICD-10-CM | POA: Diagnosis not present

## 2016-01-02 DIAGNOSIS — I351 Nonrheumatic aortic (valve) insufficiency: Secondary | ICD-10-CM | POA: Diagnosis not present

## 2016-01-02 NOTE — Patient Instructions (Signed)
Medication Instructions:  None  Labwork: None  Testing/Procedures: None  Follow-Up: Your physician wants you to follow-up in: 9-12 months with Dr. Smith.  You will receive a reminder letter in the mail two months in advance. If you don't receive a letter, please call our office to schedule the follow-up appointment.   Any Other Special Instructions Will Be Listed Below (If Applicable).     If you need a refill on your cardiac medications before your next appointment, please call your pharmacy.   

## 2016-01-02 NOTE — Progress Notes (Signed)
Cardiology Office Note    Date:  01/02/2016   ID:  Courtney Reyes, DOB 10/11/1924, MRN [id number]  PCP:  Katy ApoPOLITE,RONALD D, MD  Cardiologist: Lesleigh NoeHenry W Nicolena Schurman III, MD   Chief Complaint  Patient presents with  . Atrial Fibrillation    History of Present Illness:  Courtney Reyes is a 82 y.o. female who presents for Paroxysmal atrial arrhythmia, hypertension, hypertrophic cardiomyopathy, and aortic regurgitation.   She is accompanied by daughter. Her only complaint is back pain. She denies palpitations and dyspnea. She has not had syncope.    Past Medical History:  Diagnosis Date  . Aortic regurgitation 03/16/2015  . Apical variant hypertrophic cardiomyopathy (HCC) 03/16/2015  . Cataracts, bilateral   . Cerebral aneurysm   . Hypercholesteremia   . Hypertension   . Osteoarthritis   . PAF (paroxysmal atrial fibrillation) (HCC) 03/23/2015    No past surgical history on file.  Current Medications: Outpatient Medications Prior to Visit  Medication Sig Dispense Refill  . acetaminophen (TYLENOL 8 HOUR) 650 MG CR tablet Take 1 tablet (650 mg total) by mouth every 8 (eight) hours as needed for pain. 30 tablet 2  . apixaban (ELIQUIS) 2.5 MG TABS tablet Take 1 tablet (2.5 mg total) by mouth 2 (two) times daily. 180 tablet 1  . atenolol (TENORMIN) 100 MG tablet Take 100 mg by mouth daily.    . calcium-vitamin D (OSCAL 500/200 D-3) 500-200 MG-UNIT tablet Take 1 tablet by mouth daily with breakfast.    . chlorthalidone (HYGROTON) 25 MG tablet Take 25 mg by mouth daily as needed (elevated BP or swelling).     . dorzolamide-timolol (COSOPT) 22.3-6.8 MG/ML ophthalmic solution Place 1 drop into both eyes 2 (two) times daily.  11  . latanoprost (XALATAN) 0.005 % ophthalmic solution Place 1 drop into both eyes daily.  11  . potassium chloride (K-DUR) 10 MEQ tablet Take 10 mEq by mouth 2 (two) times daily.    . Red Yeast Rice 600 MG CAPS Take 600 mg by mouth daily.    Marland Kitchen. aspirin 325 MG tablet Take 325  mg by mouth daily.     No facility-administered medications prior to visit.      Allergies:   Aleve [naproxen sodium]   Social History   Social History  . Marital status: Married    Spouse name: N/A  . Number of children: N/A  . Years of education: N/A   Social History Main Topics  . Smoking status: Never Smoker  . Smokeless tobacco: Never Used  . Alcohol use No  . Drug use: No  . Sexual activity: Not Asked   Other Topics Concern  . None   Social History Narrative  . None     Family History:  The patient's family history includes Healthy in her father and mother.   ROS:   Please see the history of present illness.      All other systems reviewed and are negative.   PHYSICAL EXAM:   VS:  BP 140/78   Pulse 64   Ht 5\' 4"  (1.626 m)   Wt 130 lb 12.8 oz (59.3 kg)   BMI 22.45 kg/m    GEN: Well nourished, well developed, in no acute distress  HEENT: normal  Neck: no JVD, carotid bruits, or masses Cardiac: RRR; 2-3/6 decrescendo diastolic murmur of AR. No rubs, or gallops,no edema.  Respiratory:  clear to auscultation bilaterally, normal work of breathing GI: soft, nontender, nondistended, + BS MS: no deformity  or atrophy  Skin: warm and dry, no rash Neuro:  Alert and Oriented x 3, Strength and sensation are intact Psych: euthymic mood, full affect  Wt Readings from Last 3 Encounters:  01/02/16 130 lb 12.8 oz (59.3 kg)  06/10/15 123 lb 6.4 oz (56 kg)  03/23/15 129 lb 12.8 oz (58.9 kg)      Studies/Labs Reviewed:   EKG:  EKG  No new data  Recent Labs: 03/23/2015: BUN 22; Creat 1.09; Hemoglobin 13.7; Platelets 223; Potassium 4.3; Sodium 140; TSH 1.34   Lipid Panel No results found for: CHOL, TRIG, HDL, CHOLHDL, VLDL, LDLCALC, LDLDIRECT  Additional studies/ records that were reviewed today include:  02/02/15 Echocardiogram: ------------------------------------------------------------------- Study Conclusions  - Left ventricle: There was hypertrophy of  the apex. Systolic   function was normal. The estimated ejection fraction was in the   range of 60% to 65%. Wall motion was normal; there were no   regional wall motion abnormalities. Doppler parameters are   consistent with abnormal left ventricular relaxation (grade 1   diastolic dysfunction). Doppler parameters are consistent with   high ventricular filling pressure. - Aortic valve: There was moderate to severe regurgitation. - Ascending aorta: The ascending aorta was mildly dilated. - Mitral valve: Calcified annulus. There was mild regurgitation. - Left atrium: The atrium was moderately dilated. - Pulmonary arteries: Systolic pressure was mildly increased. PA   peak pressure: 39 mm Hg (S). - Pericardium, extracardiac: A small pericardial effusion was   identified.  Impressions:  - Normal LV systolic funtion; apical LVH (possible apical variant   hypertrophic cardiomyopathy); consider MRI to further assess;   grade 1 diastolic dysfunction with elevated LV filling pressure;   moderate LAE; moderate to severe AI; mild MR; trace TR with   mildly elevated pulmonary pressure; small pericardial effusion.   ASSESSMENT:    1. PAF (paroxysmal atrial fibrillation) (HCC)   2. Apical variant hypertrophic cardiomyopathy (HCC)   3. Nonrheumatic aortic valve insufficiency   4. Chronic anticoagulation      PLAN:  In order of problems listed above:  1. Asymptomatic. She denies palpitations. No episodes of syncope or other CV complaints. 2. No change in therapy is needed 3. Clearly audible decrescendo murmur of aortic regurgitation with relatively normal pulse pressure. No signs or symptoms of volume overload. 4. No bleeding on low dose Apixiban.    Medication Adjustments/Labs and Tests Ordered: Current medicines are reviewed at length with the patient today.  Concerns regarding medicines are outlined above.  Medication changes, Labs and Tests ordered today are listed in the Patient  Instructions below. There are no Patient Instructions on file for this visit.   Signed, Lesleigh NoeHenry W Angelise Petrich III, MD  01/02/2016 12:38 PM    [facility] [address], [place], [place]  [address] Phone: [phone]; Fax: [phone]) [phone]

## 2016-01-10 ENCOUNTER — Telehealth: Payer: Self-pay | Admitting: Interventional Cardiology

## 2016-01-10 NOTE — Telephone Encounter (Signed)
Patients daughter aware samples will be placed at the front desk.

## 2016-01-10 NOTE — Telephone Encounter (Signed)
Patient's daughter wants to know if we have any samples of 2.5 mg Eliquis

## 2016-02-01 ENCOUNTER — Other Ambulatory Visit: Payer: Self-pay | Admitting: Cardiology

## 2016-02-03 ENCOUNTER — Other Ambulatory Visit: Payer: Self-pay | Admitting: Interventional Cardiology

## 2016-04-06 DIAGNOSIS — I480 Paroxysmal atrial fibrillation: Secondary | ICD-10-CM | POA: Diagnosis not present

## 2016-04-06 DIAGNOSIS — I422 Other hypertrophic cardiomyopathy: Secondary | ICD-10-CM | POA: Diagnosis not present

## 2016-04-06 DIAGNOSIS — I10 Essential (primary) hypertension: Secondary | ICD-10-CM | POA: Diagnosis not present

## 2016-04-06 DIAGNOSIS — E7800 Pure hypercholesterolemia, unspecified: Secondary | ICD-10-CM | POA: Diagnosis not present

## 2016-05-10 DIAGNOSIS — Z961 Presence of intraocular lens: Secondary | ICD-10-CM | POA: Diagnosis not present

## 2016-05-10 DIAGNOSIS — H401233 Low-tension glaucoma, bilateral, severe stage: Secondary | ICD-10-CM | POA: Diagnosis not present

## 2016-11-06 DIAGNOSIS — I480 Paroxysmal atrial fibrillation: Secondary | ICD-10-CM | POA: Diagnosis not present

## 2016-11-06 DIAGNOSIS — Z0000 Encounter for general adult medical examination without abnormal findings: Secondary | ICD-10-CM | POA: Diagnosis not present

## 2016-11-06 DIAGNOSIS — I10 Essential (primary) hypertension: Secondary | ICD-10-CM | POA: Diagnosis not present

## 2016-11-06 DIAGNOSIS — E7800 Pure hypercholesterolemia, unspecified: Secondary | ICD-10-CM | POA: Diagnosis not present

## 2016-11-06 DIAGNOSIS — Z1389 Encounter for screening for other disorder: Secondary | ICD-10-CM | POA: Diagnosis not present

## 2016-11-19 ENCOUNTER — Encounter: Payer: Self-pay | Admitting: Interventional Cardiology

## 2016-11-29 ENCOUNTER — Encounter: Payer: Self-pay | Admitting: Interventional Cardiology

## 2016-11-29 ENCOUNTER — Ambulatory Visit (INDEPENDENT_AMBULATORY_CARE_PROVIDER_SITE_OTHER): Payer: PPO | Admitting: Interventional Cardiology

## 2016-11-29 VITALS — BP 128/76 | HR 53 | Ht 64.0 in | Wt 123.4 lb

## 2016-11-29 DIAGNOSIS — I480 Paroxysmal atrial fibrillation: Secondary | ICD-10-CM | POA: Diagnosis not present

## 2016-11-29 DIAGNOSIS — R001 Bradycardia, unspecified: Secondary | ICD-10-CM

## 2016-11-29 DIAGNOSIS — Z7901 Long term (current) use of anticoagulants: Secondary | ICD-10-CM | POA: Diagnosis not present

## 2016-11-29 DIAGNOSIS — I422 Other hypertrophic cardiomyopathy: Secondary | ICD-10-CM

## 2016-11-29 DIAGNOSIS — I351 Nonrheumatic aortic (valve) insufficiency: Secondary | ICD-10-CM

## 2016-11-29 NOTE — Progress Notes (Signed)
Cardiology Office Note    Date:  11/29/2016   ID:  Courtney Reyes, DOB 02/22/1924, MRN [id number]  PCP:  Renford DillsPolite, Ronald, MD  Cardiologist: Lesleigh NoeHenry W Shelbey Spindler III, MD   Chief Complaint  Patient presents with  . Atrial Fibrillation    History of Present Illness:  Courtney Reyes is a 83 y.o. female who presents for Paroxysmal atrial arrhythmia, hypertension, hypertrophic cardiomyopathy, and aortic regurgitation.   She is doing well. She denies dyspnea. She has not had syncope and chest pain. No lower extremity swelling orthopnea has been noted.    Past Medical History:  Diagnosis Date  . Aortic regurgitation 03/16/2015  . Apical variant hypertrophic cardiomyopathy (HCC) 03/16/2015  . Cataracts, bilateral   . Cerebral aneurysm   . Hypercholesteremia   . Hypertension   . Osteoarthritis   . PAF (paroxysmal atrial fibrillation) (HCC) 03/23/2015    No past surgical history on file.  Current Medications: Outpatient Medications Prior to Visit  Medication Sig Dispense Refill  . acetaminophen (TYLENOL 8 HOUR) 650 MG CR tablet Take 1 tablet (650 mg total) by mouth every 8 (eight) hours as needed for pain. 30 tablet 2  . calcium-vitamin D (OSCAL 500/200 D-3) 500-200 MG-UNIT tablet Take 1 tablet by mouth daily with breakfast.    . chlorthalidone (HYGROTON) 25 MG tablet Take 25 mg by mouth daily as needed (elevated BP or swelling).     . dorzolamide-timolol (COSOPT) 22.3-6.8 MG/ML ophthalmic solution Place 1 drop into both eyes 2 (two) times daily.  11  . ELIQUIS 2.5 MG TABS tablet TAKE 1 TABLET BY MOUTH 2 TIMES DAILY 180 tablet 3  . latanoprost (XALATAN) 0.005 % ophthalmic solution Place 1 drop into both eyes daily.  11  . potassium chloride (K-DUR) 10 MEQ tablet Take 10 mEq by mouth daily.     . Red Yeast Rice 600 MG CAPS Take 600 mg by mouth daily.    Marland Kitchen. atenolol (TENORMIN) 100 MG tablet Take 100 mg by mouth daily.     No facility-administered medications prior to visit.      Allergies:    Aleve [naproxen sodium]   Social History   Social History  . Marital status: Married    Spouse name: N/A  . Number of children: N/A  . Years of education: N/A   Social History Main Topics  . Smoking status: Never Smoker  . Smokeless tobacco: Never Used  . Alcohol use No  . Drug use: No  . Sexual activity: Not Asked   Other Topics Concern  . None   Social History Narrative  . None     Family History:  The patient's family history includes Healthy in her father and mother.   ROS:   Please see the history of present illness.    Back pain, dizziness, difficulty with balance is been noted.  All other systems reviewed and are negative.   PHYSICAL EXAM:   VS:  BP 128/76 (BP Location: Left Arm)   Pulse (!) 53   Ht 5\' 4"  (1.626 m)   Wt 123 lb 6.4 oz (56 kg)   BMI 21.18 kg/m    GEN: Well nourished, well developed, in no acute distress  HEENT: normal  Neck: no JVD, carotid bruits, or masses Cardiac: IIRR; there is a 2/6 decrescendo diastolic murmur of aortic regurgitation.No rubs, gallops,or edema . Respiratory:  clear to auscultation bilaterally, normal work of breathing GI: soft, nontender, nondistended, + BS MS: no deformity or atrophy  Skin:  warm and dry, no rash Neuro:  Alert and Oriented x 3, Strength and sensation are intact Psych: euthymic mood, full affect  Wt Readings from Last 3 Encounters:  11/29/16 123 lb 6.4 oz (56 kg)  01/02/16 130 lb 12.8 oz (59.3 kg)  06/10/15 123 lb 6.4 oz (56 kg)      Studies/Labs Reviewed:   EKG:  EKG  Sinus bradycardia, left ventricular hypertrophy, marked repolarization abnormality felt to be secondary to left ventricular hypertrophy.  Recent Labs: No results found for requested labs within last 8760 hours.   Lipid Panel No results found for: CHOL, TRIG, HDL, CHOLHDL, VLDL, LDLCALC, LDLDIRECT  Additional studies/ records that were reviewed today include:  No new data    ASSESSMENT:    1. PAF (paroxysmal atrial  fibrillation) (HCC)   2. Chronic anticoagulation   3. Nonrheumatic aortic valve insufficiency   4. Apical variant hypertrophic cardiomyopathy (HCC)   5. Bradycardia      PLAN:  In order of problems listed above:  1. Currently maintaining sinus bradycardia. Current therapy includes atenolol 50 mg per day. As best I can tell she takes this if systolic blood pressures over 409 mmHg otherwise the medicine is not being used. 2. No bleeding on the current anticoagulation regimen. 3. No change in clinical exam. 4. No symptoms. 5. Related to age and concomitant beta blocker therapy. No change for now. Atenolol is being taken as needed and not on a regular basis.  No change in the current medical regimen. Clinical follow-up in one year.  Medication Adjustments/Labs and Tests Ordered: Current medicines are reviewed at length with the patient today.  Concerns regarding medicines are outlined above.  Medication changes, Labs and Tests ordered today are listed in the Patient Instructions below. Patient Instructions  Medication Instructions:  Your physician recommends that you continue on your current medications as directed. Please refer to the Current Medication list given to you today.   Labwork: None    Testing/Procedures: None   Follow-Up: Your physician wants you to follow-up in 1 year with Dr. Katrinka Blazing. You will receive a reminder letter in the mail two months in advance. If you don't receive a letter, please call our office to schedule the follow-up appointment.   Any Other Special Instructions Will Be Listed Below (If Applicable).     If you need a refill on your cardiac medications before your next appointment, please call your pharmacy.      Signed, Lesleigh Noe, MD  11/29/2016 6:22 PM    [facility] [address] [place], [place], [place]  [address] Phone: [phone]; Fax: [phone]

## 2016-11-29 NOTE — Patient Instructions (Signed)

## 2017-01-10 ENCOUNTER — Encounter: Payer: Self-pay | Admitting: *Deleted

## 2017-01-10 ENCOUNTER — Other Ambulatory Visit: Payer: Self-pay | Admitting: *Deleted

## 2017-01-10 NOTE — Patient Outreach (Signed)
[facility] Care Management  01/10/2017  Courtney Reyes 09/15/1924 [id number]  Referral via [employer] for [employer]TA member: Reason for referral : Multiple chronic conditions; falls risk, Atrial fibrillation, glaucoma, HTN, brain aneurysm  Telephone call to patient; left HIPPA compliant voice mail requesting call back.  Immediate call back from patient's daughter/caregiver. She was advised of reason for call & [facility] care [facility] services.  Caregiver refused to give HIPPA verification. States she does not give that information over the phone.    Advised unable to continue conversation without HIPPA verification. Caregiver states patient does not have health care needs.      Plan: Send [facility] contact information. Send MD closure letter. Send to care management assistant to close case.   Courtney CanLinda Chikita Dogan, RN BSN CCM Care Management Coordinator [facility] Care [facility]  [phone]

## 2017-04-21 ENCOUNTER — Emergency Department (HOSPITAL_COMMUNITY): Payer: PPO

## 2017-04-21 ENCOUNTER — Inpatient Hospital Stay (HOSPITAL_COMMUNITY)
Admission: EM | Admit: 2017-04-21 | Discharge: 2017-04-25 | DRG: 065 | Disposition: A | Discharge status: Hospice | Payer: PPO | Attending: Internal Medicine | Admitting: Internal Medicine

## 2017-04-21 ENCOUNTER — Encounter (HOSPITAL_COMMUNITY): Payer: Self-pay | Admitting: Radiology

## 2017-04-21 ENCOUNTER — Other Ambulatory Visit: Payer: Self-pay

## 2017-04-21 DIAGNOSIS — Z79899 Other long term (current) drug therapy: Secondary | ICD-10-CM

## 2017-04-21 DIAGNOSIS — G8191 Hemiplegia, unspecified affecting right dominant side: Secondary | ICD-10-CM | POA: Diagnosis present

## 2017-04-21 DIAGNOSIS — W06XXXA Fall from bed, initial encounter: Secondary | ICD-10-CM | POA: Diagnosis present

## 2017-04-21 DIAGNOSIS — M1990 Unspecified osteoarthritis, unspecified site: Secondary | ICD-10-CM | POA: Diagnosis not present

## 2017-04-21 DIAGNOSIS — I639 Cerebral infarction, unspecified: Secondary | ICD-10-CM

## 2017-04-21 DIAGNOSIS — I6523 Occlusion and stenosis of bilateral carotid arteries: Secondary | ICD-10-CM | POA: Diagnosis not present

## 2017-04-21 DIAGNOSIS — I63412 Cerebral infarction due to embolism of left middle cerebral artery: Secondary | ICD-10-CM | POA: Diagnosis not present

## 2017-04-21 DIAGNOSIS — I480 Paroxysmal atrial fibrillation: Secondary | ICD-10-CM | POA: Diagnosis not present

## 2017-04-21 DIAGNOSIS — E7800 Pure hypercholesterolemia, unspecified: Secondary | ICD-10-CM | POA: Diagnosis not present

## 2017-04-21 DIAGNOSIS — J189 Pneumonia, unspecified organism: Secondary | ICD-10-CM

## 2017-04-21 DIAGNOSIS — E785 Hyperlipidemia, unspecified: Secondary | ICD-10-CM

## 2017-04-21 DIAGNOSIS — I509 Heart failure, unspecified: Secondary | ICD-10-CM | POA: Diagnosis not present

## 2017-04-21 DIAGNOSIS — F05 Delirium due to known physiological condition: Secondary | ICD-10-CM | POA: Diagnosis not present

## 2017-04-21 DIAGNOSIS — Z7901 Long term (current) use of anticoagulants: Secondary | ICD-10-CM | POA: Diagnosis not present

## 2017-04-21 DIAGNOSIS — Z515 Encounter for palliative care: Secondary | ICD-10-CM

## 2017-04-21 DIAGNOSIS — I6521 Occlusion and stenosis of right carotid artery: Secondary | ICD-10-CM | POA: Diagnosis not present

## 2017-04-21 DIAGNOSIS — R4789 Other speech disturbances: Secondary | ICD-10-CM | POA: Diagnosis not present

## 2017-04-21 DIAGNOSIS — I447 Left bundle-branch block, unspecified: Secondary | ICD-10-CM | POA: Diagnosis present

## 2017-04-21 DIAGNOSIS — R4701 Aphasia: Secondary | ICD-10-CM | POA: Diagnosis not present

## 2017-04-21 DIAGNOSIS — I6789 Other cerebrovascular disease: Secondary | ICD-10-CM | POA: Diagnosis not present

## 2017-04-21 DIAGNOSIS — R001 Bradycardia, unspecified: Secondary | ICD-10-CM | POA: Diagnosis not present

## 2017-04-21 DIAGNOSIS — I361 Nonrheumatic tricuspid (valve) insufficiency: Secondary | ICD-10-CM | POA: Diagnosis not present

## 2017-04-21 DIAGNOSIS — I422 Other hypertrophic cardiomyopathy: Secondary | ICD-10-CM

## 2017-04-21 DIAGNOSIS — R1310 Dysphagia, unspecified: Secondary | ICD-10-CM | POA: Diagnosis not present

## 2017-04-21 DIAGNOSIS — Z66 Do not resuscitate: Secondary | ICD-10-CM

## 2017-04-21 DIAGNOSIS — I63512 Cerebral infarction due to unspecified occlusion or stenosis of left middle cerebral artery: Secondary | ICD-10-CM | POA: Diagnosis present

## 2017-04-21 DIAGNOSIS — R0602 Shortness of breath: Secondary | ICD-10-CM | POA: Diagnosis not present

## 2017-04-21 DIAGNOSIS — Y92003 Bedroom of unspecified non-institutional (private) residence as the place of occurrence of the external cause: Secondary | ICD-10-CM | POA: Diagnosis not present

## 2017-04-21 DIAGNOSIS — R29721 NIHSS score 21: Secondary | ICD-10-CM | POA: Diagnosis present

## 2017-04-21 DIAGNOSIS — Z886 Allergy status to analgesic agent status: Secondary | ICD-10-CM | POA: Diagnosis not present

## 2017-04-21 DIAGNOSIS — I10 Essential (primary) hypertension: Secondary | ICD-10-CM

## 2017-04-21 DIAGNOSIS — R471 Dysarthria and anarthria: Secondary | ICD-10-CM | POA: Diagnosis not present

## 2017-04-21 DIAGNOSIS — H9190 Unspecified hearing loss, unspecified ear: Secondary | ICD-10-CM | POA: Diagnosis present

## 2017-04-21 DIAGNOSIS — Z823 Family history of stroke: Secondary | ICD-10-CM

## 2017-04-21 DIAGNOSIS — I351 Nonrheumatic aortic (valve) insufficiency: Secondary | ICD-10-CM | POA: Diagnosis present

## 2017-04-21 DIAGNOSIS — R270 Ataxia, unspecified: Secondary | ICD-10-CM | POA: Diagnosis not present

## 2017-04-21 DIAGNOSIS — H02402 Unspecified ptosis of left eyelid: Secondary | ICD-10-CM | POA: Diagnosis present

## 2017-04-21 DIAGNOSIS — I671 Cerebral aneurysm, nonruptured: Secondary | ICD-10-CM | POA: Diagnosis not present

## 2017-04-21 DIAGNOSIS — H269 Unspecified cataract: Secondary | ICD-10-CM

## 2017-04-21 DIAGNOSIS — Z9114 Patient's other noncompliance with medication regimen: Secondary | ICD-10-CM

## 2017-04-21 DIAGNOSIS — I482 Chronic atrial fibrillation: Secondary | ICD-10-CM | POA: Diagnosis present

## 2017-04-21 LAB — COMPREHENSIVE METABOLIC PANEL
ALK PHOS: 70 U/L (ref 38–126)
ALT: 10 U/L — AB (ref 14–54)
AST: 25 U/L (ref 15–41)
Albumin: 2.9 g/dL — ABNORMAL LOW (ref 3.5–5.0)
Anion gap: 10 (ref 5–15)
BILIRUBIN TOTAL: 0.6 mg/dL (ref 0.3–1.2)
BUN: 15 mg/dL (ref 6–20)
CALCIUM: 8.9 mg/dL (ref 8.9–10.3)
CHLORIDE: 106 mmol/L (ref 101–111)
CO2: 23 mmol/L (ref 22–32)
CREATININE: 0.97 mg/dL (ref 0.44–1.00)
GFR, EST AFRICAN AMERICAN: 58 mL/min — AB (ref >60)
GFR, EST NON AFRICAN AMERICAN: 50 mL/min — AB (ref >60)
Glucose, Bld: 130 mg/dL — ABNORMAL HIGH (ref 65–99)
Potassium: 3.7 mmol/L (ref 3.5–5.1)
Sodium: 139 mmol/L (ref 135–145)
Total Protein: 6.7 g/dL (ref 6.5–8.1)

## 2017-04-21 LAB — DIFFERENTIAL
BASOS ABS: 0 10*3/uL (ref 0.0–0.1)
Basophils Relative: 0 %
Eosinophils Absolute: 0.1 10*3/uL (ref 0.0–0.7)
Eosinophils Relative: 1 %
LYMPHS ABS: 2.3 10*3/uL (ref 0.7–4.0)
LYMPHS PCT: 54 %
MONO ABS: 0.2 10*3/uL (ref 0.1–1.0)
MONOS PCT: 5 %
NEUTROS ABS: 1.7 10*3/uL (ref 1.7–7.7)
Neutrophils Relative %: 40 %

## 2017-04-21 LAB — I-STAT TROPONIN, ED: TROPONIN I, POC: 0.03 ng/mL (ref 0.00–0.08)

## 2017-04-21 LAB — CBG MONITORING, ED: Glucose-Capillary: 115 mg/dL — ABNORMAL HIGH (ref 65–99)

## 2017-04-21 LAB — CBC
HEMATOCRIT: 36.2 % (ref 36.0–46.0)
HEMOGLOBIN: 11.8 g/dL — AB (ref 12.0–15.0)
MCH: 31.4 pg (ref 26.0–34.0)
MCHC: 32.6 g/dL (ref 30.0–36.0)
MCV: 96.3 fL (ref 78.0–100.0)
Platelets: 208 10*3/uL (ref 150–400)
RBC: 3.76 MIL/uL — AB (ref 3.87–5.11)
RDW: 14 % (ref 11.5–15.5)
WBC: 4.3 10*3/uL (ref 4.0–10.5)

## 2017-04-21 LAB — I-STAT CHEM 8, ED
BUN: 17 mg/dL (ref 6–20)
CREATININE: 0.9 mg/dL (ref 0.44–1.00)
Calcium, Ion: 1.2 mmol/L (ref 1.15–1.40)
Chloride: 106 mmol/L (ref 101–111)
GLUCOSE: 127 mg/dL — AB (ref 65–99)
HEMATOCRIT: 36 % (ref 36.0–46.0)
Hemoglobin: 12.2 g/dL (ref 12.0–15.0)
POTASSIUM: 3.6 mmol/L (ref 3.5–5.1)
Sodium: 142 mmol/L (ref 135–145)
TCO2: 23 mmol/L (ref 22–32)

## 2017-04-21 LAB — PROTIME-INR
INR: 1.18
Prothrombin Time: 14.9 seconds (ref 11.4–15.2)

## 2017-04-21 LAB — LIPID PANEL
CHOL/HDL RATIO: 3.1 ratio
CHOLESTEROL: 158 mg/dL (ref 0–200)
HDL: 51 mg/dL (ref >40)
LDL CALC: 98 mg/dL (ref 0–99)
TRIGLYCERIDES: 46 mg/dL (ref <150)
VLDL: 9 mg/dL (ref 0–40)

## 2017-04-21 LAB — APTT: APTT: 30 s (ref 24–36)

## 2017-04-21 LAB — HEMOGLOBIN A1C
Hgb A1c MFr Bld: 6 % — ABNORMAL HIGH (ref 4.8–5.6)
Mean Plasma Glucose: 125.5 mg/dL

## 2017-04-21 MED ORDER — IOPAMIDOL (ISOVUE-370) INJECTION 76%
INTRAVENOUS | Status: AC
  Administered 2017-04-21: 90 mL
  Filled 2017-04-21: qty 100

## 2017-04-21 MED ORDER — DORZOLAMIDE HCL-TIMOLOL MAL 2-0.5 % OP SOLN
1.0000 [drp] | Freq: Two times a day (BID) | OPHTHALMIC | Status: DC
  Administered 2017-04-21 – 2017-04-25 (×8): 1 [drp] via OPHTHALMIC
  Filled 2017-04-21 (×2): qty 10

## 2017-04-21 MED ORDER — ACETAMINOPHEN 650 MG RE SUPP
650.0000 mg | RECTAL | Status: DC | PRN
  Administered 2017-04-21: 650 mg via RECTAL
  Filled 2017-04-21: qty 1

## 2017-04-21 MED ORDER — ACETAMINOPHEN 160 MG/5ML PO SOLN: 650.0000 mg | ORAL | Status: DC | PRN

## 2017-04-21 MED ORDER — POLYETHYL GLYCOL-PROPYL GLYCOL 0.4-0.3 % OP GEL: Freq: Every day | OPHTHALMIC | Status: DC | PRN

## 2017-04-21 MED ORDER — IPRATROPIUM-ALBUTEROL 0.5-2.5 (3) MG/3ML IN SOLN
3.0000 mL | Freq: Once | RESPIRATORY_TRACT | Status: AC
  Administered 2017-04-21: 3 mL via RESPIRATORY_TRACT
  Filled 2017-04-21: qty 3

## 2017-04-21 MED ORDER — LATANOPROST 0.005 % OP SOLN
1.0000 [drp] | Freq: Every day | OPHTHALMIC | Status: DC
  Administered 2017-04-23 – 2017-04-24 (×2): 1 [drp] via OPHTHALMIC
  Filled 2017-04-21 (×2): qty 2.5

## 2017-04-21 MED ORDER — STROKE: EARLY STAGES OF RECOVERY BOOK
Freq: Once | Status: AC
  Administered 2017-04-21: 15:00:00
  Filled 2017-04-21: qty 1

## 2017-04-21 MED ORDER — ATORVASTATIN CALCIUM 10 MG PO TABS
20.0000 mg | ORAL_TABLET | Freq: Every day | ORAL | Status: DC
  Filled 2017-04-21: qty 1

## 2017-04-21 MED ORDER — ARTIFICIAL TEARS OPHTHALMIC OINT
TOPICAL_OINTMENT | Freq: Every day | OPHTHALMIC | Status: DC | PRN
  Administered 2017-04-23: 1 via OPHTHALMIC
  Filled 2017-04-21: qty 3.5

## 2017-04-21 MED ORDER — ASPIRIN EC 81 MG PO TBEC: 81.0000 mg | DELAYED_RELEASE_TABLET | Freq: Every day | ORAL | Status: DC

## 2017-04-21 MED ORDER — ACETAMINOPHEN 325 MG PO TABS: 650.0000 mg | ORAL_TABLET | ORAL | Status: DC | PRN

## 2017-04-21 MED ORDER — SODIUM CHLORIDE 0.9 % IV SOLN
INTRAVENOUS | Status: DC
  Administered 2017-04-21 – 2017-04-23 (×3): via INTRAVENOUS

## 2017-04-21 NOTE — Progress Notes (Signed)
Cosopt medication not load in pyxis pharmacy notified

## 2017-04-21 NOTE — Code Documentation (Signed)
84 year old female presents to [facility] via [place] as code stroke.  She lives at home with her daughter who states the patient can walk with a walker - is at baseline alert and oriented x4.  The daughter reports she looked in on her mom at 0700 this am - she was alert - told her daughter to given her and hour before she was ready for breakfast - speech was at baseline.  At 0740 the daughter her a noise - ran to check on her mom and found her on the floor with right side not moving and garbled speech. EMS called the code stroke in the field.  On arrival she is alert - aphasic - can follow few simple commands but not consistently - speech unintelligible.  She is on Eliquis at home which her daugter reports she does take - denies missing dose.  Patient met at bridge by stroke team and ED team - taken to CT scan.  Dr. Leonel Ramsay at bedside - CP scan done.  No evidence of LVO.  No acute treatment to offer patient.  Dr. Leonel Ramsay talking with family.  [facility] 22.  Handoff to Rylan RN.

## 2017-04-21 NOTE — ED Provider Notes (Signed)
[facility] EMERGENCY DEPARTMENT Provider Note   CSN: [id number] Arrival date & time: 04/21/17  4098   An emergency department physician performed an initial assessment on this suspected stroke patient at [phone].  History   Chief Complaint Chief Complaint  Patient presents with  . Code Stroke    HPI Courtney Reyes is a 84 y.o. female.  Patient brought in by EMS as a code stroke.  Patient at 7:00 in the morning.  EMS noted right-sided weakness and trouble speaking.  Family stated that she was speaking fine at 7 in the morning.  However she did not get up and walks with a cannot vouch for how her motor strength was.  At 740 family heard a thud in the room they went in the room and found her on the floor.  Patient arrived as a code stroke.      Past Medical History:  Diagnosis Date  . Aortic regurgitation 03/16/2015  . Apical variant hypertrophic cardiomyopathy (HCC) 03/16/2015  . Cataracts, bilateral   . Cerebral aneurysm   . Hypercholesteremia   . Hypertension   . Osteoarthritis   . PAF (paroxysmal atrial fibrillation) (HCC) 03/23/2015    Patient Active Problem List   Diagnosis Date Noted  . Chronic anticoagulation 01/01/2016  . PAF (paroxysmal atrial fibrillation) (HCC) 03/23/2015  . Aortic regurgitation 03/16/2015  . Apical variant hypertrophic cardiomyopathy (HCC) 03/16/2015  . Bradycardia 03/15/2015    History reviewed. No pertinent surgical history.  OB History    No data available       Home Medications    Prior to Admission medications   Medication Sig Start Date End Date Taking? Authorizing Provider  acetaminophen (TYLENOL 8 HOUR) 650 MG CR tablet Take 1 tablet (650 mg total) by mouth every 8 (eight) hours as needed for pain. 03/23/15   Robbie Lis M, PA-C  atenolol (TENORMIN) 50 MG tablet Take 50 mg by mouth daily as needed. For SBP greater than 140 11/28/16   [provider]  calcium-vitamin D (OSCAL 500/200 D-3) 500-200  MG-UNIT tablet Take 1 tablet by mouth daily with breakfast.    [provider]  chlorthalidone (HYGROTON) 25 MG tablet Take 25 mg by mouth daily as needed (elevated BP or swelling).     [provider]  dorzolamide-timolol (COSOPT) 22.3-6.8 MG/ML ophthalmic solution Place 1 drop into both eyes 2 (two) times daily. 12/27/14   [provider]  ELIQUIS 2.5 MG TABS tablet TAKE 1 TABLET BY MOUTH 2 TIMES DAILY 02/03/16   Lyn Records, MD  latanoprost (XALATAN) 0.005 % ophthalmic solution Place 1 drop into both eyes daily. 12/27/14   [provider]  potassium chloride (K-DUR) 10 MEQ tablet Take 10 mEq by mouth daily.     [provider]  Red Yeast Rice 600 MG CAPS Take 600 mg by mouth daily.    [provider]    Family History Family History  Problem Relation Age of Onset  . Healthy Father   . Healthy Mother     Social History Social History   Tobacco Use  . Smoking status: Never Smoker  . Smokeless tobacco: Never Used  Substance Use Topics  . Alcohol use: No    Alcohol/week: 0.0 oz  . Drug use: No     Allergies   Aleve [naproxen sodium]   Review of Systems Review of Systems  Unable to perform ROS: Patient nonverbal     Physical Exam Updated Vital Signs BP 128/77  Pulse (!) 56   Temp (!) 97.4 F (36.3 C) (Oral)   Resp 17   Ht 1.626 m (5\' 4" )   Wt 64 kg (141 lb 1.5 oz)   SpO2 99%   BMI 24.22 kg/m   Physical Exam  Constitutional: She appears well-developed and well-nourished. No distress.  HENT:  Head: Normocephalic and atraumatic.  Mouth/Throat: Oropharynx is clear and moist.  Eyes: Conjunctivae are normal. Pupils are equal, round, and reactive to light.  Patient with neglect to the right side.  We will not move her eyes over to that side.  Neck: Normal range of motion. Neck supple.  Cardiovascular: Normal rate, regular rhythm and normal heart sounds.  Pulmonary/Chest: Effort normal and breath sounds  normal.  Abdominal: Soft. Bowel sounds are normal.  Musculoskeletal: She exhibits no edema or deformity.  Neurological: She is alert. A cranial nerve deficit is present. She exhibits abnormal muscle tone.  Patient with weakness to right upper extremity right lower extremity.  Significant weakness.  Good strength to the left side.  Patient has neglect to the right side.  Patient has a aphasia.  Skin: Skin is warm. Capillary refill takes less than 2 seconds.  Nursing note and vitals reviewed.    ED Treatments / Results  Labs (all labs ordered are listed, but only abnormal results are displayed) Labs Reviewed  CBC - Abnormal; Notable for the following components:      Result Value   RBC 3.76 (*)    Hemoglobin 11.8 (*)    All other components within normal limits  COMPREHENSIVE METABOLIC PANEL - Abnormal; Notable for the following components:   Glucose, Bld 130 (*)    Albumin 2.9 (*)    ALT 10 (*)    GFR calc non Af Amer 50 (*)    GFR calc Af Amer 58 (*)    All other components within normal limits  CBG MONITORING, ED - Abnormal; Notable for the following components:   Glucose-Capillary 115 (*)    All other components within normal limits  I-STAT CHEM 8, ED - Abnormal; Notable for the following components:   Glucose, Bld 127 (*)    All other components within normal limits  PROTIME-INR  APTT  DIFFERENTIAL  I-STAT TROPONIN, ED    EKG  EKG Interpretation  Date/Time:  Sunday April 21 2017 08:45:56 EDT Ventricular Rate:  57 PR Interval:    QRS Duration: 134 QT Interval:  511 QTC Calculation: 498 R Axis:   20 Text Interpretation:  Sinus rhythm Prolonged PR interval Left bundle branch block No previous ECGs available Confirmed by Vanetta MuldersZackowski, Magalie Almon [phone]([id number]) on 04/21/2017 8:55:04 AM       Radiology Ct Head Code Stroke Wo Contrast  Result Date: 04/21/2017 CLINICAL DATA:  Code stroke.  Right-sided weakness.  Aphasia. EXAM: CT HEAD WITHOUT CONTRAST TECHNIQUE: Contiguous axial  images were obtained from the base of the skull through the vertex without intravenous contrast. COMPARISON:  None. FINDINGS: Brain: There is no evidence of acute large territory infarct, intracranial hemorrhage, mass, midline shift, or extra-axial fluid collection. Age related cerebral atrophy is noted. Periventricular white matter hypodensities are nonspecific but compatible with chronic small vessel ischemia, mild for age. Vascular: Calcified atherosclerosis at the skull base. 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 Skull: No fracture or focal osseous lesion.  Sinuses/Orbits: Visualized paranasal sinuses and mastoid air cells are clear. No acute orbital findings. Other: None. ASPECTS [facility] Stroke Program Early CT Score) - Ganglionic level infarction (caudate, lentiform nuclei, internal capsule, insula, M1-M3 cortex): 7 - Supraganglionic infarction (M4-M6 cortex): 3 Total score (0-10 with 10 being normal): 10 IMPRESSION: 1. No evidence of acute intracranial abnormality. 2. ASPECTS is 10. 3. Mild chronic small vessel ischemic disease. 4. Abnormal appearance of the right cavernous sinus region related to history of large ICA aneurysm status post remote balloon occlusion. These results were communicated to Dr. Amada Jupiter at 8:36 am on 04/21/2017 by text page via the [facility] messaging system. Electronically Signed   By: Sebastian Ache M.D.   On: 04/21/2017 08:40    Procedures Procedures (including critical care time)  CRITICAL CARE Performed by: Vanetta Mulders Total critical care time: 30 minutes Critical care time was exclusive of separately billable procedures and treating other patients. Critical care was necessary to treat or prevent imminent or life-threatening  deterioration. Critical care was time spent personally by me on the following activities: development of treatment plan with patient and/or surrogate as well as nursing, discussions with consultants, evaluation of patient's response to treatment, examination of patient, obtaining history from patient or surrogate, ordering and performing treatments and interventions, ordering and review of laboratory studies, ordering and review of radiographic studies, pulse oximetry and re-evaluation of patient's condition.  Medications Ordered in ED Medications  ipratropium-albuterol (DUONEB) 0.5-2.5 (3) MG/3ML nebulizer solution 3 mL (not administered)  iopamidol (ISOVUE-370) 76 % injection (90 mLs  Contrast Given 04/21/17 0830)     Initial Impression / Assessment and Plan / ED Course  I have reviewed the triage vital signs and the nursing notes.  Pertinent labs & imaging results that were available during my care of the patient were reviewed by me and considered in my medical decision making (see chart for details).    Patient arrived as a code stroke.  Patient immediately went to head CT.  Seen by the stroke team.  Patient brought in by EMS.  Came from home.  Right-sided weakness and trouble speaking.  Patient apparently was okay at 7 in the morning however they did not see her up and walking but she was talking fine.  Family members at 0 740 heard a thud in the room and upon arrival they found her mother on the floor.  Patiently normally walks with a walker at home.  Per EMS patient is on blood thinners.  Neglect of gaze to the right side CT scan without acute findings but her presentation with the aid aphasia and no use of right upper extremity and lower extremity.  Other than minimal movement.  Highly suggestive and MCA stroke.  Perfusion scans were done which showed completed infarct  Of significant size left MCA area.  Patient not a candidate for TPA based to being on blood thinners.  Based on the  perfusion scan not a candidate for any interventional process.  Neuro hospitalist discussed this with family I discussed it with as well.  Patient will be a medicine admission.  Patient will be admitted by the hospitalist service.  Patient was known to have a little bit of wheezy type breathing.  No history of asthma or COPD.  Do not feel it was due to any upper airway obstruction.  Patient able to stick her tongue out fine.  Patient be treated with albuterol Atrovent nebulizer and it resolved.   Final Clinical Impressions(s) / ED Diagnoses  Final diagnoses:  Acute ischemic stroke [facility])    ED Discharge Orders    None       Vanetta Mulders, MD 04/21/17 [phone]

## 2017-04-21 NOTE — Consult Note (Signed)
NEUROHOSPITALISTS - STROKE CONSULTATION NOTE   Requesting Physician: Dr. Vanetta Mulders [facility]: Dr. Ritta Slot Admit date: 04/21/2017    Chief Complaint:  Right sided weakness and Aphasia  History obtained from:  Patient's family, EMS and Chart     HPI   Courtney Reyes is an 84 y.o. female PMH of HTN, HLD, AFIB on Eliquis, Hx of hypertrophic cardiomyopathy, and aortic regurgitation who presents with acute onset Right sided weakness and aphasia. Family reports patient woke up at approximately 7 AM, spoke to her daughter from her bed. At 7:40 AM daughter heard patient fall out of her bed, she had right sided weakness and aphasia, EMS was called and patient transported to ED. Code Stroke Head CT has no acute findings. CTA shows no  LVO and Acute, small to moderate-sized left frontoparietal MCA territory infarct. 1.4 mismatch ratio.   Per family at baseline: walks with walker, some mild memory problems that have worsened over the past 6 months  Found in Cardiology office note - Dr Katrinka Blazing 05/2015 Apixaban 2.5 mg twice a day but the patient is only taking it once a day because of economic concerns  Date last known well: Date: 04/21/2017 Time last known well: Time: 07:40 tPA Given: No: Patient taking Eliquis Modified Rankin: Rankin Score=3 NIH Stroke Scale= 21  Past Medical History    Past Medical History:  Diagnosis Date  . Aortic regurgitation 03/16/2015  . Apical variant hypertrophic cardiomyopathy (HCC) 03/16/2015  . Cataracts, bilateral   . Cerebral aneurysm   . Hypercholesteremia   . Hypertension   . Osteoarthritis   . PAF (paroxysmal atrial fibrillation) (HCC) 03/23/2015   Past Surgical History  History reviewed. No pertinent surgical history.  Family History   Family History  Problem Relation Age of Onset  . Healthy Father   . Healthy Mother    Social History   reports that  has  never smoked. she has never used smokeless tobacco. She reports that she does not drink alcohol or use drugs.  Allergies   Allergies  Allergen Reactions  . Aleve [Naproxen Sodium] Hives   Medications Prior to Admission   No current facility-administered medications on file prior to encounter.    Current Outpatient Medications on File Prior to Encounter  Medication Sig Dispense Refill  . allopurinol (ZYLOPRIM) 100 MG tablet Take 100 mg by mouth daily.    Marland Kitchen atenolol (TENORMIN) 50 MG tablet Take 100 mg by mouth daily. For SBP greater than 140    . chlorthalidone (HYGROTON) 25 MG tablet Take 25 mg by mouth daily as needed (elevated BP or swelling).     . dorzolamide-timolol (COSOPT) 22.3-6.8 MG/ML ophthalmic solution Place 1 drop into both eyes 2 (two) times daily.  11  . OVER THE COUNTER MEDICATION Take 2 tablets by mouth daily. Vision Clear B    . OVER THE COUNTER MEDICATION Take 1 tablet by mouth daily. Vision Clear A    . POLYETHYL GLYCOL-PROPYL GLYCOL OP Apply 1-2 drops to eye daily as needed (dry eyes).    . potassium chloride (K-DUR) 10 MEQ tablet Take 10 mEq by mouth 2 (two) times daily.     Marland Kitchen acetaminophen (TYLENOL 8 HOUR) 650 MG CR tablet Take 1 tablet (650 mg total) by mouth every 8 (eight) hours as needed for pain. 30 tablet 2  . ELIQUIS 2.5 MG TABS tablet TAKE 1 TABLET BY MOUTH 2 TIMES DAILY 180 tablet 3  . latanoprost (XALATAN) 0.005 % ophthalmic solution Place 1  drop into both eyes at bedtime.   11   ROS  History obtained from unobtainable from patient due to Aphasia General ROS: Patient nods her head up and down when asked if she has a H/A, otherwise no response to other ROS questions asked. She does not appear to be in pain. No acute SOB but does have audible expiratory wheezing. No areas of skin breakdown noted  Physical Examination   Vitals:   04/21/17 0857 04/21/17 0900 04/21/17 0930 04/21/17 1000  BP:  128/77 116/71 120/79  Pulse:  (!) 56 (!) 55 (!) 53  Resp:  17  20 14   Temp:      TempSrc:      SpO2:  99% 97% 98%  Weight: 64 kg (141 lb 1.5 oz)     Height: 5\' 4"  (1.626 m)      HEENT-  Normocephalic,  Cardiovascular - Regular rate and rhythm  Respiratory - decreased BS bilaterally. Slightly labored breathing, + expirataory wheezing. Abdomen - soft and non-tender, BS normal Extremities- no edema or cyanosis Skin-warm and dry  Neurological Examination  Mental Status: Alert, Globally aphasic. When she attempts to speak her speech is moderately  dysarthric, intermittently able to follow some simple commands, unsure of mentation baseline. +Right sided neglect.  Cranial Nerves: II: Pupils are equal, round, and sluggishly reactive to light. No blink to threat on Right.   III,IV, VI: EOMI in Left eye and able to cross midline. Right eye has limited movement - unsure if this is a baseline finding. +ptosis of Left eye is a baseline finding per family. No nystagmus noted on exam.   V: Facial sensation is extinct on right VII: Facial movement is decreased on right VIII: hearing is intact, however, patient is extremely hard of hearing X: Uvula appears to elevate symmetrically XI: Shoulder shrug is weak on right XII: tongue appears midline without atrophy or fasciculations.  Motor: Tone is normal. Bulk is normal.  4/5 strength on left - likely baseline finding, 0/5 on right UE/LE Sensor: Sensation is extinct on right side. Deep Tendon Reflexes: 2+ and symmetric throughout in the biceps and patellae Plantars: Toes are downgoing bilaterally.  Cerebellar: normal finger-to-nose on left. Unable to perform on right Gait: not tested  Laboratory Results  CBC: Recent Labs  Lab 04/21/17 0813 04/21/17 0822  WBC 4.3  --   HGB 11.8* 12.2  HCT 36.2 36.0  MCV 96.3  --   PLT 208  --    Basic Metabolic Panel: Recent Labs  Lab 04/21/17 0813 04/21/17 0822  NA 139 142  K 3.7 3.6  CL 106 106  CO2 23  --   GLUCOSE 130* 127*  BUN 15 17  CREATININE 0.97 0.90   CALCIUM 8.9  --    Coagulation Studies: Recent Labs    04/21/17 0813  APTT 30  INR 1.18   Imaging Results  Ct Angio Head W Or Wo Contrast  Addendum Date: 04/21/2017   ADDENDUM REPORT: 04/21/2017 09:45 ADDENDUM: Interlobular septal thickening in the lung apices with small pleural effusions. Consider obtaining chest radiographs to evaluate for edema/CHF. Electronically Signed   By: Sebastian Ache M.D.   On: 04/21/2017 09:45   Result Date: 04/21/2017 CLINICAL DATA:  Right-sided weakness and aphasia. EXAM: CT ANGIOGRAPHY HEAD AND NECK CT PERFUSION BRAIN TECHNIQUE: Multidetector CT imaging of the head and neck was performed using the standard protocol during bolus administration of intravenous contrast. Multiplanar CT image reconstructions and MIPs were obtained to evaluate the  vascular anatomy. Carotid stenosis measurements (when applicable) are obtained utilizing NASCET criteria, using the distal internal carotid diameter as the denominator. Multiphase CT imaging of the brain was performed following IV bolus contrast injection. Subsequent parametric perfusion maps were calculated using RAPID software. CONTRAST:  90mL ISOVUE-370 IOPAMIDOL (ISOVUE-370) INJECTION 76% COMPARISON:  None. FINDINGS: CTA NECK FINDINGS Aortic arch: Normal variant aortic arch branching pattern with common origin of the brachiocephalic and left common carotid arteries and with the left vertebral artery arising from the arch distal to the left subclavian artery. Mild arch atherosclerosis. Widely patent brachiocephalic and subclavian arteries. Right carotid system: The common carotid artery is patent proximally but demonstrates progressively diminishing contrast opacification distally without significant opacification of the carotid bifurcation. The internal carotid artery is occluded throughout the neck. No significant ECA opacification is identified either. Left carotid system: Patent without evidence of stenosis or dissection.  Tortuous mid cervical ICA. Vertebral arteries: Patent without evidence of stenosis or dissection. Strongly dominant left vertebral artery. Skeleton: Moderately advanced diffuse cervical disc degeneration. Other neck: No mass or enlarged lymph nodes. Upper chest: Motion artifact in the lung apices with bronchial wall thickening and interlobular septal thickening. Mild subpleural opacity may reflect atelectasis and/or mild fibrosis. Partially visualized small pleural effusions. Review of the MIP images confirms the above findings CTA HEAD FINDINGS Anterior circulation: The intracranial right ICA is occluded proximally with reconstitution of the supraclinoid segment via the right posterior communicating artery. A right supraclinoid ICA aneurysm projecting posteriorly measures 10 x 8 mm. 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 There is no residual opacification of this aneurysm. The intracranial left ICA is widely patent. The left cavernous ICA is tortuous with a 4 mm superiorly projecting aneurysm noted proximally. The right A1 segment is absent or occluded. The left ACA is widely patent and supplies the right. The MCAs are patent without M1 stenosis. Major M2 branches appear patent proximally, however there is a decreased number of distal left MCA branch vessels corresponding to the area of infarct demonstrated on perfusion imaging. Posterior circulation: The intracranial vertebral arteries are patent to the basilar. There is mild right V4 stenosis. A long segment fenestration is incidentally noted extending from the V3 to proximal V4 segments on the right. The basilar artery is widely patent. There is a fetal origin of the left PCA. A relatively large right posterior communicating artery is also present. No significant proximal PCA stenosis is seen. There is a variant vessel extending  between the distal basilar artery and left A1 segment. No aneurysm. Venous sinuses: As permitted by contrast timing, patent. Anatomic variants: Multiple variants as detailed above. Delayed phase: Not performed. Review of the MIP images confirms the above findings CT Brain Perfusion Findings: CBF (<30%) Volume: 35mL Perfusion (Tmax>6.0s) volume: 49mL Mismatch Volume: 14mL Infarction Location:Left frontoparietal cortex and white matter beginning at the posterior aspect of the operculum and extending superiorly. IMPRESSION: 1. Acute, small to moderate-sized left frontoparietal MCA territory infarct. 1.4 mismatch ratio. 2. No acute large vessel occlusion. Decreased number of distal left MCA branch vessels corresponding to the acute infarct. 3. 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 4. Reconstitution of the supraclinoid right ICA. 10 x 8 mm right supraclinoid ICA aneurysm. 5. 4 mm left cavernous ICA aneurysm. 6. Widely patent left common and internal carotid arteries. 7.  Widely patent vertebral arteries. The study was reviewed in person with Dr. Amada Jupiter on 04/21/2017 at 8:45 a.m. Electronically Signed: By: Sebastian Ache M.D. On: 04/21/2017 09:34   Ct Angio Neck W Or Wo Contrast  Addendum Date: 04/21/2017   ADDENDUM REPORT: 04/21/2017 09:45 ADDENDUM: Interlobular septal thickening in the lung apices with small pleural effusions. Consider obtaining chest radiographs to evaluate for edema/CHF. Electronically Signed   By: Sebastian Ache M.D.   On: 04/21/2017 09:45   Result Date: 04/21/2017 CLINICAL DATA:  Right-sided weakness and aphasia. EXAM: CT ANGIOGRAPHY HEAD AND NECK CT PERFUSION BRAIN TECHNIQUE: Multidetector CT imaging of the head and neck was performed using the standard protocol during bolus administration of intravenous contrast. Multiplanar CT image reconstructions and MIPs were obtained to evaluate the vascular anatomy. Carotid stenosis  measurements (when applicable) are obtained utilizing NASCET criteria, using the distal internal carotid diameter as the denominator. Multiphase CT imaging of the brain was performed following IV bolus contrast injection. Subsequent parametric perfusion maps were calculated using RAPID software. CONTRAST:  90mL ISOVUE-370 IOPAMIDOL (ISOVUE-370) INJECTION 76% COMPARISON:  None. FINDINGS: CTA NECK FINDINGS Aortic arch: Normal variant aortic arch branching pattern with common origin of the brachiocephalic and left common carotid arteries and with the left vertebral artery arising from the arch distal to the left subclavian artery. Mild arch atherosclerosis. Widely patent brachiocephalic and subclavian arteries. Right carotid system: The common carotid artery is patent proximally but demonstrates progressively diminishing contrast opacification distally without significant opacification of the carotid bifurcation. The internal carotid artery is occluded throughout the neck. No significant ECA opacification is identified either. Left carotid system: Patent without evidence of stenosis or dissection. Tortuous mid cervical ICA. Vertebral arteries: Patent without evidence of stenosis or dissection. Strongly dominant left vertebral artery. Skeleton: Moderately advanced diffuse cervical disc degeneration. Other neck: No mass or enlarged lymph nodes. Upper chest: Motion artifact in the lung apices with bronchial wall thickening and interlobular septal thickening. Mild subpleural opacity may reflect atelectasis and/or mild fibrosis. Partially visualized small pleural effusions. Review of the MIP images confirms the above findings CTA HEAD FINDINGS Anterior circulation: The intracranial right ICA is occluded proximally with reconstitution of the supraclinoid segment via the right posterior communicating artery. A right supraclinoid ICA aneurysm projecting posteriorly measures 10 x 8 mm. 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 There is no residual opacification of this aneurysm. The intracranial left ICA is widely patent. The left cavernous ICA is tortuous with a 4 mm superiorly projecting aneurysm noted proximally. The right A1 segment is absent or occluded. The left ACA is widely patent and supplies the right. The MCAs are patent without M1 stenosis. Major M2 branches appear patent proximally, however there is a decreased number of distal left MCA branch vessels corresponding to the area of infarct demonstrated on perfusion imaging. Posterior circulation: The intracranial vertebral arteries are patent to the basilar. There is mild right V4 stenosis. A long segment fenestration is incidentally noted extending from the V3 to proximal V4 segments on the right. The basilar artery is widely patent. There is a fetal origin of the left PCA. A relatively large right posterior communicating artery is also present. No significant proximal PCA stenosis is seen. There is a variant vessel extending between the distal basilar artery and left A1 segment. No aneurysm. Venous sinuses: As permitted by contrast timing, patent. Anatomic variants:  Multiple variants as detailed above. Delayed phase: Not performed. Review of the MIP images confirms the above findings CT Brain Perfusion Findings: CBF (<30%) Volume: 35mL Perfusion (Tmax>6.0s) volume: 49mL Mismatch Volume: 14mL Infarction Location:Left frontoparietal cortex and white matter beginning at the posterior aspect of the operculum and extending superiorly. IMPRESSION: 1. Acute, small to moderate-sized left frontoparietal MCA territory infarct. 1.4 mismatch ratio. 2. No acute large vessel occlusion. Decreased number of distal left MCA branch vessels corresponding to the acute infarct. 3. Chronic occlusion of the right ICA related to remote treatment of a large cavernous  region ICA aneurysm. 4. Reconstitution of the supraclinoid right ICA. 10 x 8 mm right supraclinoid ICA aneurysm. 5. 4 mm left cavernous ICA aneurysm. 6. Widely patent left common and internal carotid arteries. 7. Widely patent vertebral arteries. The study was reviewed in person with Dr. Amada Jupiter on 04/21/2017 at 8:45 a.m. Electronically Signed: By: Sebastian Ache M.D. On: 04/21/2017 09:34   Ct Cerebral Perfusion W Contrast  Addendum Date: 04/21/2017   ADDENDUM REPORT: 04/21/2017 09:45 ADDENDUM: Interlobular septal thickening in the lung apices with small pleural effusions. Consider obtaining chest radiographs to evaluate for edema/CHF. Electronically Signed   By: Sebastian Ache M.D.   On: 04/21/2017 09:45   Result Date: 04/21/2017 CLINICAL DATA:  Right-sided weakness and aphasia. EXAM: CT ANGIOGRAPHY HEAD AND NECK CT PERFUSION BRAIN TECHNIQUE: Multidetector CT imaging of the head and neck was performed using the standard protocol during bolus administration of intravenous contrast. Multiplanar CT image reconstructions and MIPs were obtained to evaluate the vascular anatomy. Carotid stenosis measurements (when applicable) are obtained utilizing NASCET criteria, using the distal internal carotid diameter as the denominator. Multiphase CT imaging of the brain was performed following IV bolus contrast injection. Subsequent parametric perfusion maps were calculated using RAPID software. CONTRAST:  90mL ISOVUE-370 IOPAMIDOL (ISOVUE-370) INJECTION 76% COMPARISON:  None. FINDINGS: CTA NECK FINDINGS Aortic arch: Normal variant aortic arch branching pattern with common origin of the brachiocephalic and left common carotid arteries and with the left vertebral artery arising from the arch distal to the left subclavian artery. Mild arch atherosclerosis. Widely patent brachiocephalic and subclavian arteries. Right carotid system: The common carotid artery is patent proximally but demonstrates progressively diminishing  contrast opacification distally without significant opacification of the carotid bifurcation. The internal carotid artery is occluded throughout the neck. No significant ECA opacification is identified either. Left carotid system: Patent without evidence of stenosis or dissection. Tortuous mid cervical ICA. Vertebral arteries: Patent without evidence of stenosis or dissection. Strongly dominant left vertebral artery. Skeleton: Moderately advanced diffuse cervical disc degeneration. Other neck: No mass or enlarged lymph nodes. Upper chest: Motion artifact in the lung apices with bronchial wall thickening and interlobular septal thickening. Mild subpleural opacity may reflect atelectasis and/or mild fibrosis. Partially visualized small pleural effusions. Review of the MIP images confirms the above findings CTA HEAD FINDINGS Anterior circulation: The intracranial right ICA is occluded proximally with reconstitution of the supraclinoid segment via the right posterior communicating artery. A right supraclinoid ICA aneurysm projecting posteriorly measures 10 x 8 mm. 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 There is no residual opacification of this aneurysm. The intracranial left ICA is widely patent. The left cavernous ICA is tortuous with a 4 mm superiorly projecting aneurysm noted proximally. The right A1 segment is absent or occluded. The left ACA is widely patent and  supplies the right. The MCAs are patent without M1 stenosis. Major M2 branches appear patent proximally, however there is a decreased number of distal left MCA branch vessels corresponding to the area of infarct demonstrated on perfusion imaging. Posterior circulation: The intracranial vertebral arteries are patent to the basilar. There is mild right V4 stenosis. A long segment fenestration is incidentally noted extending from  the V3 to proximal V4 segments on the right. The basilar artery is widely patent. There is a fetal origin of the left PCA. A relatively large right posterior communicating artery is also present. No significant proximal PCA stenosis is seen. There is a variant vessel extending between the distal basilar artery and left A1 segment. No aneurysm. Venous sinuses: As permitted by contrast timing, patent. Anatomic variants: Multiple variants as detailed above. Delayed phase: Not performed. Review of the MIP images confirms the above findings CT Brain Perfusion Findings: CBF (<30%) Volume: 35mL Perfusion (Tmax>6.0s) volume: 49mL Mismatch Volume: 14mL Infarction Location:Left frontoparietal cortex and white matter beginning at the posterior aspect of the operculum and extending superiorly. IMPRESSION: 1. Acute, small to moderate-sized left frontoparietal MCA territory infarct. 1.4 mismatch ratio. 2. No acute large vessel occlusion. Decreased number of distal left MCA branch vessels corresponding to the acute infarct. 3. 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 4. Reconstitution of the supraclinoid right ICA. 10 x 8 mm right supraclinoid ICA aneurysm. 5. 4 mm left cavernous ICA aneurysm. 6. Widely patent left common and internal carotid arteries. 7. Widely patent vertebral arteries. The study was reviewed in person with Dr. Amada Jupiter on 04/21/2017 at 8:45 a.m. Electronically Signed: By: Sebastian Ache M.D. On: 04/21/2017 09:34   Ct Head Code Stroke Wo Contrast  Result Date: 04/21/2017 CLINICAL DATA:  Code stroke.  Right-sided weakness.  Aphasia. EXAM: CT HEAD WITHOUT CONTRAST TECHNIQUE: Contiguous axial images were obtained from the base of the skull through the vertex without intravenous contrast. COMPARISON:  None. FINDINGS: Brain: There is no evidence of acute large territory infarct, intracranial hemorrhage, mass, midline shift, or extra-axial fluid  collection. Age related cerebral atrophy is noted. Periventricular white matter hypodensities are nonspecific but compatible with chronic small vessel ischemia, mild for age. Vascular: Calcified atherosclerosis at the skull base. 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 Skull: No fracture or focal osseous lesion. Sinuses/Orbits: Visualized paranasal sinuses and mastoid air cells are clear. No acute orbital findings. Other: None. ASPECTS [facility] Stroke Program Early CT Score) - Ganglionic level infarction (caudate, lentiform nuclei, internal capsule, insula, M1-M3 cortex): 7 - Supraganglionic infarction (M4-M6 cortex): 3 Total score (0-10 with 10 being normal): 10 IMPRESSION: 1. No evidence of acute intracranial abnormality. 2. ASPECTS is 10. 3. Mild chronic small vessel ischemic disease. 4. Abnormal appearance of the right cavernous sinus region related to history of large ICA aneurysm status post remote balloon occlusion. These results were communicated to Dr. Amada Jupiter at 8:36 am on 04/21/2017 by text page via the [facility] messaging system. Electronically Signed   By: Sebastian Ache M.D.   On: 04/21/2017 08:40    IMPRESSION AND PLAN  Ms. Courtney Reyes is a 84 y.o. female with PMH of HTN, HLD, AFIB on Eliquis, Hx of hypertrophic cardiomyopathy, and aortic regurgitation who presents with acute onset Right sided  weakness and aphasia. Imaging reveals:  Acute, small/moderate-sized left frontoparietal MCA territory infarct.  1.4 mismatch ratio  Suspected Etiology: likely cardioembolic from AFIB, medication noncompliance with Eliquis Resultant Symptoms: Right sided deficits Stroke Risk Factors: atrial fibrillation, hyperlipidemia and hypertension Other Stroke Risk Factors: Advanced  age,Family hx stroke, hypertrophic cardiomyopathy, and aortic regurgitation  Outstanding Stroke Work-up Studies:     Echocardiogram:                                                    PENDING  PLAN  04/21/2017  Patient will be admitted to the hospitalists and stroke workup will be obtained. No need for MRI as it will not change medical management Frequent Neurochecks  Telemetry Monitoring NPO until passes Stroke swallow screen Start  Lipitor 20 mg daily Start Aspirin 81 mg daily HgbA1C and Lipid Profile PT/OT/SLP Consult PM & Rehab Consult Case Management /MSW Ongoing aggressive stroke risk factor management Patient will be counseled to be compliant with her antithrombotic medications Patient will be counseled on Lifestyle modifications including, Diet, Exercise, and Stress Follow up with [facility] in 6 weeks May want to discuss Palliative Care consultation with family for goals of care  NEUROLOGICAL  ISSUES   Hx of ANEURYSM 10 x 8 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 4 mm left cavernous ICA aneurysm.  DYSPHAGIA: NPO until passes SLP swallow evaluation Aspiration Precautions in progress  MEDICAL ISSUES   AFIB, CHRONIC: Continue to HOLD AC - restarting AC will depend on results of repeat imaging  HYPERTENSION: Stable, Avoid Hypotension Permissive hypertension (OK if <220/120) for 24-48 hours post stroke and then gradually normalized within 5-7 days. Long term BP goal normotensive. May slowly restart home B/P medications after 48 hours Home Meds: yes  HYPERLIPIDEMIA:    Component Value Date/Time   CHOL 158 04/21/2017 0929   TRIG 46 04/21/2017 0929   HDL 51 04/21/2017 0929   CHOLHDL 3.1 04/21/2017 0929   VLDL 9 04/21/2017 0929   LDLCALC 98 04/21/2017 0929  Home Meds:  NONE Start Lipitor 20 mg daily LDL  goal < 70 Continue statin at discharge  DIABETES: Lab Results  Component Value Date   HGBA1C 6.0 (H) 04/21/2017    HgbA1c goal < 7.0 Continue CBG monitoring and SSI to maintain glucose 140-180 mg/dl DM education, if necessary    Hospital day # 0 VTE prophylaxis: SCD's Diet - Diet NPO time specified     FAMILY UPDATES:  family at bedside  TEAM UPDATES:Zackowski, Lorin PicketScott, MD STATUS:  Not on file, need to discuss with family  Discharge Information  Prior Home Stroke Medications: Eliquis (apixaban) daily  Discharge Stroke Meds:  Please discharge patient on TBD     Disposition: Final discharge disposition not confirmed Therapy Recs:               PENDING  Follow up Appointments  Follow Up:  Renford DillsPolite, Ronald, MD -PCP Follow up in 1-2 weeks      Assessment and plan discussed with with attending physician and they are in agreement.    Beryl MeagerMary A Aaniya Sterba, ANP-C [facility] 04/21/2017, 10:32 AM  04/21/2017 ATTENDING ASSESSMENT     Please page stroke NP/ PA / MD from 8am -4 pm   You can look them up on [email or web address]  Password [email or web address]

## 2017-04-21 NOTE — Progress Notes (Signed)
CCMD called RN to notify patients HR was in the 40's. MD(Sheehan ) notified of results

## 2017-04-21 NOTE — ED Triage Notes (Signed)
Pt brought in by [facility] from home for right sided weakness. LNW 0700 by daughter who states she checked on her and found her to be speaking normally at the time. At 0740 daughter states she heard a thud in pt room, and on arrival found her mother on the floor. States pt normally walks with walker at home and is A+Ox4 at baseline. Per EMS pt is on blood thinners, however none found in pt med bag.

## 2017-04-21 NOTE — Evaluation (Signed)
Clinical/Bedside Swallow Evaluation Patient Details  Name: Courtney Reyes MRN: [id number] Date of Birth: 03/01/1924  Today's Date: 04/21/2017 Time: SLP Start Time (ACUTE ONLY): 1250 SLP Stop Time (ACUTE ONLY): 1325 SLP Time Calculation (min) (ACUTE ONLY): 35 min  Past Medical History:  Past Medical History:  Diagnosis Date  . Aortic regurgitation 03/16/2015  . Apical variant hypertrophic cardiomyopathy (HCC) 03/16/2015  . Cataracts, bilateral   . Cerebral aneurysm   . Hypercholesteremia   . Hypertension   . Osteoarthritis   . PAF (paroxysmal atrial fibrillation) (HCC) 03/23/2015   Past Surgical History: History reviewed. No pertinent surgical history. HPI:  Courtney I Holleyis a 84 y.o.femalewith medical history significantfor hypertension, hyperlipidemia, atrial fibrillation on Eliquis, history of hypertrophic cardiomyopathy and aortic regurgitation who presented to our emergency department this morning with right-sided weakness and dysarthria. Stroke head CT shows no acute findings CTA of the head showed no large vessel occlusion but an acute moderate sized left frontoparietal MCA territory infarction.    Assessment / Plan / Recommendation Clinical Impression   Patient presents with signs of oropharyngeal dysphagia; suspect reduced airway protection with multiple consistencies due to premature spillage, poor oral control. She is aphasic, following some basic commands but with groping oral movements noted during oral exam. Pt has poor awareness of bolus, requires max assistance to bring cup to lips. There is anterior bolus loss with thin, nectar and honey thick liquids regardless of method of delivery (teaspoon, cup sip). She has delayed coughing with liquids, as well as an immediate, significant coughing episode after teaspoon of honey-thick liquids. No overt signs of aspiration with puree. Recommend she remain NPO pending instrumental assessment. Extensive education with family; will follow up  next date for MBS, cognitive-linguistic assessment.    SLP Visit Diagnosis: Dysphagia, oropharyngeal phase (R13.12)    Aspiration Risk  Moderate aspiration risk    Diet Recommendation NPO   Medication Administration: Via alternative means    Other  Recommendations Oral Care Recommendations: Oral care QID Other Recommendations: Have oral suction available;Remove water pitcher;Prohibited food (jello, ice cream, thin soups)   Follow up Recommendations Other (comment)(tbd)      Frequency and Duration            Prognosis Prognosis for Safe Diet Advancement: Good Barriers to Reach Goals: Language deficits      Swallow Study   General Date of Onset: 04/21/17 HPI: Courtney I Holleyis a 84 y.o.femalewith medical history significantfor hypertension, hyperlipidemia, atrial fibrillation on Eliquis, history of hypertrophic cardiomyopathy and aortic regurgitation who presented to our emergency department this morning with right-sided weakness and dysarthria. Stroke head CT shows no acute findings CTA of the head showed no large vessel occlusion but an acute moderate sized left frontoparietal MCA territory infarction. Type of Study: Bedside Swallow Evaluation Previous Swallow Assessment: none in chart Diet Prior to this Study: NPO Temperature Spikes Noted: No Respiratory Status: Room air History of Recent Intubation: No Behavior/Cognition: Alert;Cooperative Oral Cavity Assessment: Within Functional Limits Oral Care Completed by SLP: Yes Oral Cavity - Dentition: Adequate natural dentition Vision: Functional for self-feeding Self-Feeding Abilities: Needs assist(max assist) Patient Positioning: Upright in bed Baseline Vocal Quality: Low vocal intensity Volitional Cough: Cognitively unable to elicit(question motor planning vs auditory comprehenion) Volitional Swallow: Unable to elicit    Oral/Motor/Sensory Function Overall Oral Motor/Sensory Function: Moderate impairment Facial ROM:  Reduced right;Suspected CN VII (facial) dysfunction Facial Symmetry: Abnormal symmetry right;Suspected CN VII (facial) dysfunction Facial Strength: Reduced right;Suspected CN VII (facial) dysfunction Lingual ROM: Other (  Comment)(groping movements, does not protrude tongue with visual cues) Mandible: Within Functional Limits   Ice Chips Ice chips: Impaired Presentation: Spoon Oral Phase Impairments: Poor awareness of bolus;Reduced labial seal Oral Phase Functional Implications: Oral holding;Right anterior spillage   Thin Liquid Thin Liquid: Impaired Presentation: Spoon;Cup Oral Phase Impairments: Reduced labial seal;Poor awareness of bolus Oral Phase Functional Implications: Right anterior spillage;Oral holding;Prolonged oral transit Pharyngeal  Phase Impairments: Cough - Delayed    Nectar Thick Nectar Thick Liquid: Impaired Presentation: Cup Oral Phase Impairments: Reduced labial seal;Poor awareness of bolus Oral phase functional implications: Right anterior spillage;Oral holding;Prolonged oral transit Pharyngeal Phase Impairments: Cough - Delayed   Honey Thick Honey Thick Liquid: Impaired Presentation: Spoon;Cup Oral Phase Impairments: Reduced labial seal;Poor awareness of bolus;Reduced lingual movement/coordination Oral Phase Functional Implications: Right anterior spillage;Prolonged oral transit;Oral holding Pharyngeal Phase Impairments: Cough - Immediate;Cough - Delayed   Puree Puree: Within functional limits Presentation: Spoon   Solid   GO   Solid: Not tested       Rondel Baton, MS, CCC-SLP Speech-Language Pathologist [phone]  Arlana Lindau 04/21/2017,1:37 PM

## 2017-04-21 NOTE — Progress Notes (Signed)
New Admission Note: ? Arrival Method:Bed Mental Orientation:No Telemetry: YES M-04 Assessment: Completed Skin:intact IV:(R)hand 0.9%@75ml /hr (R) AC Pain:FLACC=0 Safety Measures: Safety Fall Prevention Plan was given, discussed. Admission: Completed 3W: Family has been orientated to the room, unit and the staff. Family: Orders have been reviewed and implemented. Will continue to monitor the patient. Call light has been placed within reach and bed alarm has been activated.   Stroke /TIA Report [facility]

## 2017-04-21 NOTE — H&P (Addendum)
History and Physical    Courtney Reyes ZOX:[id number] DOB: 1924/11/21 DOA: 04/21/2017  PCP: Renford Dills, MD  Patient coming from: home  I have personally briefly reviewed patient's old medical records in [facility]  Chief Complaint: Right-sided weakness and dysarthria  HPI: Courtney Reyes is a 84 y.o. female with medical history significant for hypertension, hyperlipidemia, atrial fibrillation on Eliquis, history of hypertrophic cardiomyopathy and aortic regurgitation who presented to our emergency department this morning with right-sided weakness and dysarthria.  Patient's daughter had checked on her at 7 AM and spoke to her daughter from the bed.  At 7:40 AM the daughter heard the patient fall out of her bed she went to the bedroom and found her to be on the floor with right-sided weakness and aphasia.  EMS was called and patient was transported to the emergency department.  Stroke head CT shows no acute findings CTA of the head showed no large vessel occlusion but an acute moderate sized left frontoparietal MCA territory infarction.  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  Constitution of the supraclinoid right ICA 10 x 8 mm right supraclinoid ICA aneurysm.  4 mm left cavernous ICA aneurysm.  Widely patent left common and internal carotid arteries.  Widely patent vertebral arteries.  Recent is severely dysarthric and unable to give any history whatsoever the entire history is obtained from the record, Dr. Benedetto Goad, and the patient's 2 daughters who are present for the examination.  Usually the patient lives in her home when she is there there is a daughter, [name], who stays with her.  On other occasions she stays with her daughter [name].  He was at her home with [name] this morning when she had her event.  The patient walks with a walker but baize dresses feeds herself.  Her daughter does her bills but has always done that.   Over the past 6 months family notes worsening memory problems.  Neurology reports that the patient was ordered Eliquis twice daily but is apparently taking it only once daily because of financial concerns.  This was noted in 1 of the cardiology records from Dr. Michaelle Copas office in April 2017.  Initially on my evaluation patient had a left gaze preference but during the examination and my time in the room with her she started looking over to the right side.  Her speech remains very garbled difficult to understand but some things are intelligible.  She has a dense right hemiparesis.  She will be admitted onto a neuro telemetry bed for further evaluation and management of moderate left MCA territory stroke.   Review of Systems: Patient unable to speak cannot obtain review of systems.   Past Medical History:  Diagnosis Date  . Aortic regurgitation 03/16/2015  . Apical variant hypertrophic cardiomyopathy (HCC) 03/16/2015  . Cataracts, bilateral   . Cerebral aneurysm   . Hypercholesteremia   . Hypertension   . Osteoarthritis   . PAF (paroxysmal atrial fibrillation) (HCC) 03/23/2015    History reviewed. No pertinent surgical history.   reports that  has never smoked. she has never used smokeless tobacco. She reports that she does not drink alcohol or use drugs.  Allergies  Allergen Reactions  . Aleve [Naproxen Sodium] Hives    Family History  Problem Relation Age of Onset  . Healthy Father   . Healthy Mother   Patient's mother believed to have died of old age.  Her children  do not know with the patient's father died of.  He has 3 children one daughter with thyroid disease [name]).  [name] her daughter has had a stroke, diabetes, and hypertension.  The patient had a son who was murdered in [place]  Prior to Admission medications   Medication Sig Start Date End Date Taking? Authorizing Provider  allopurinol (ZYLOPRIM) 100 MG tablet Take 100 mg by mouth daily.   Yes [provider]    atenolol (TENORMIN) 50 MG tablet Take 100 mg by mouth daily. For SBP greater than 140 11/28/16  Yes [provider]  chlorthalidone (HYGROTON) 25 MG tablet Take 25 mg by mouth daily as needed (elevated BP or swelling).    Yes [provider]  dorzolamide-timolol (COSOPT) 22.3-6.8 MG/ML ophthalmic solution Place 1 drop into both eyes 2 (two) times daily. 12/27/14  Yes [provider]  OVER THE COUNTER MEDICATION Take 2 tablets by mouth daily. Vision Clear B   Yes [provider]  OVER THE COUNTER MEDICATION Take 1 tablet by mouth daily. Vision Clear A   Yes [provider]  POLYETHYL GLYCOL-PROPYL GLYCOL OP Apply 1-2 drops to eye daily as needed (dry eyes).   Yes [provider]  potassium chloride (K-DUR) 10 MEQ tablet Take 10 mEq by mouth 2 (two) times daily.    Yes [provider]  acetaminophen (TYLENOL 8 HOUR) 650 MG CR tablet Take 1 tablet (650 mg total) by mouth every 8 (eight) hours as needed for pain. 03/23/15   Robbie Lis M, PA-C  ELIQUIS 2.5 MG TABS tablet TAKE 1 TABLET BY MOUTH 2 TIMES DAILY 02/03/16   Lyn Records, MD  latanoprost (XALATAN) 0.005 % ophthalmic solution Place 1 drop into both eyes at bedtime.  12/27/14   [provider]    Physical Exam: Vitals:   04/21/17 0900 04/21/17 0930 04/21/17 1000 04/21/17 1030  BP: 128/77 116/71 120/79 121/72  Pulse: (!) 56 (!) 55 (!) 53 (!) 56  Resp: 17 20 14 16   Temp:      TempSrc:      SpO2: 99% 97% 98% 92%  Weight:      Height:       Illa.  Occasionally agitated 84 year old female who looks 20 years younger than her stated age in no acute respiratory distress Vitals:   04/21/17 0900 04/21/17 0930 04/21/17 1000 04/21/17 1030  BP: 128/77 116/71 120/79 121/72  Pulse: (!) 56 (!) 55 (!) 53 (!) 56  Resp: 17 20 14 16   Temp:      TempSrc:      SpO2: 99% 97% 98% 92%  Weight:      Height:       Eyes: PERRL, lids and conjunctivae normal ENMT: Mucous  membranes are moist. Posterior pharynx clear of any exudate or lesions.Normal dentition.  Neck: normal, supple, no masses, no thyromegaly Respiratory: Coarse breath sounds with crackles and rhonchi at the left base, no wheezing. Normal respiratory effort. No accessory muscle use.  Cardiovascular: Regular rate and rhythm, no murmurs / rubs / gallops. No extremity edema. 2+ pedal pulses. No carotid bruits.  Abdomen: no tenderness, no masses palpated. No hepatosplenomegaly. Bowel sounds positive.  Musculoskeletal: no clubbing / cyanosis. No joint deformity upper and lower extremities. Good ROM, no contractures. Normal muscle tone.  Skin: no rashes, lesions, ulcers. No induration Neurologic: Dense right hemiparesis, initially with leftward gaze preference which improved during my evaluation, sensory is intact to light touch, DTRs are hyperreflexic on the right.  Psychiatric: Agitated and frustrated at being unable to be understood    Labs on Admission: I have personally reviewed following labs and imaging studies  CBC: Recent Labs  Lab 04/21/17 0813 04/21/17 0822  WBC 4.3  --   NEUTROABS 1.7  --   HGB 11.8* 12.2  HCT 36.2 36.0  MCV 96.3  --   PLT 208  --    Basic Metabolic Panel: Recent Labs  Lab 04/21/17 0813 04/21/17 0822  NA 139 142  K 3.7 3.6  CL 106 106  CO2 23  --   GLUCOSE 130* 127*  BUN 15 17  CREATININE 0.97 0.90  CALCIUM 8.9  --    GFR: Estimated Creatinine Clearance: 35.9 mL/min (by C-G formula based on SCr of 0.9 mg/dL). Liver Function Tests: Recent Labs  Lab 04/21/17 0813  AST 25  ALT 10*  ALKPHOS 70  BILITOT 0.6  PROT 6.7  ALBUMIN 2.9*   No results for input(s): LIPASE, AMYLASE in the last 168 hours. No results for input(s): AMMONIA in the last 168 hours. Coagulation Profile: Recent Labs  Lab 04/21/17 0813  INR 1.18   Cardiac Enzymes: No results for input(s): CKTOTAL, CKMB, CKMBINDEX, TROPONINI in the last 168 hours. BNP (last 3 results) No  results for input(s): PROBNP in the last 8760 hours. HbA1C: Recent Labs    04/21/17 0929  HGBA1C 6.0*   CBG: Recent Labs  Lab 04/21/17 0810  GLUCAP 115*   Lipid Profile: Recent Labs    04/21/17 0929  CHOL 158  HDL 51  LDLCALC 98  TRIG 46  CHOLHDL 3.1   Thyroid Function Tests: No results for input(s): TSH, T4TOTAL, FREET4, T3FREE, THYROIDAB in the last 72 hours. Anemia Panel: No results for input(s): VITAMINB12, FOLATE, FERRITIN, TIBC, IRON, RETICCTPCT in the last 72 hours. Urine analysis: No results found for: COLORURINE, APPEARANCEUR, LABSPEC, PHURINE, GLUCOSEU, HGBUR, BILIRUBINUR, KETONESUR, PROTEINUR, UROBILINOGEN, NITRITE, LEUKOCYTESUR  Radiological Exams on Admission: Ct Angio Head W Or Wo Contrast  Addendum Date: 04/21/2017   ADDENDUM REPORT: 04/21/2017 09:45 ADDENDUM: Interlobular septal thickening in the lung apices with small pleural effusions. Consider obtaining chest radiographs to evaluate for edema/CHF. Electronically Signed   By: Sebastian Ache M.D.   On: 04/21/2017 09:45   Result Date: 04/21/2017 CLINICAL DATA:  Right-sided weakness and aphasia. EXAM: CT ANGIOGRAPHY HEAD AND NECK CT PERFUSION BRAIN TECHNIQUE: Multidetector CT imaging of the head and neck was performed using the standard protocol during bolus administration of intravenous contrast. Multiplanar CT image reconstructions and MIPs were obtained to evaluate the vascular anatomy. Carotid stenosis measurements (when applicable) are obtained utilizing NASCET criteria, using the distal internal carotid diameter as the denominator. Multiphase CT imaging of the brain was performed following IV bolus contrast injection. Subsequent parametric perfusion maps were calculated using RAPID software. CONTRAST:  90mL ISOVUE-370 IOPAMIDOL (ISOVUE-370) INJECTION 76% COMPARISON:  None. FINDINGS: CTA NECK FINDINGS Aortic arch: Normal variant aortic arch branching pattern with common origin of the brachiocephalic and left  common carotid arteries and with the left vertebral artery arising from the arch distal to the left subclavian artery. Mild arch atherosclerosis. Widely patent brachiocephalic and subclavian arteries. Right carotid system: The common carotid artery is patent proximally but demonstrates progressively diminishing contrast opacification distally without significant opacification of the carotid bifurcation. The internal carotid artery is occluded throughout the neck. No significant ECA opacification is identified either. Left carotid system: Patent without evidence of stenosis or dissection. Tortuous mid cervical ICA. Vertebral arteries: Patent without evidence  of stenosis or dissection. Strongly dominant left vertebral artery. Skeleton: Moderately advanced diffuse cervical disc degeneration. Other neck: No mass or enlarged lymph nodes. Upper chest: Motion artifact in the lung apices with bronchial wall thickening and interlobular septal thickening. Mild subpleural opacity may reflect atelectasis and/or mild fibrosis. Partially visualized small pleural effusions. Review of the MIP images confirms the above findings CTA HEAD FINDINGS Anterior circulation: The intracranial right ICA is occluded proximally with reconstitution of the supraclinoid segment via the right posterior communicating artery. A right supraclinoid ICA aneurysm projecting posteriorly measures 10 x 8 mm. 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 There is no residual opacification of this aneurysm. The intracranial left ICA is widely patent. The left cavernous ICA is tortuous with a 4 mm superiorly projecting aneurysm noted proximally. The right A1 segment is absent or occluded. The left ACA is widely patent and supplies the right. The MCAs are patent without M1 stenosis. Major M2 branches appear patent proximally, however  there is a decreased number of distal left MCA branch vessels corresponding to the area of infarct demonstrated on perfusion imaging. Posterior circulation: The intracranial vertebral arteries are patent to the basilar. There is mild right V4 stenosis. A long segment fenestration is incidentally noted extending from the V3 to proximal V4 segments on the right. The basilar artery is widely patent. There is a fetal origin of the left PCA. A relatively large right posterior communicating artery is also present. No significant proximal PCA stenosis is seen. There is a variant vessel extending between the distal basilar artery and left A1 segment. No aneurysm. Venous sinuses: As permitted by contrast timing, patent. Anatomic variants: Multiple variants as detailed above. Delayed phase: Not performed. Review of the MIP images confirms the above findings CT Brain Perfusion Findings: CBF (<30%) Volume: 35mL Perfusion (Tmax>6.0s) volume: 49mL Mismatch Volume: 14mL Infarction Location:Left frontoparietal cortex and white matter beginning at the posterior aspect of the operculum and extending superiorly. IMPRESSION: 1. Acute, small to moderate-sized left frontoparietal MCA territory infarct. 1.4 mismatch ratio. 2. No acute large vessel occlusion. Decreased number of distal left MCA branch vessels corresponding to the acute infarct. 3. 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 4. Reconstitution of the supraclinoid right ICA. 10 x 8 mm right supraclinoid ICA aneurysm. 5. 4 mm left cavernous ICA aneurysm. 6. Widely patent left common and internal carotid arteries. 7. Widely patent vertebral arteries. The study was reviewed in person with Dr. Amada Jupiter on 04/21/2017 at 8:45 a.m. Electronically Signed: By: Sebastian Ache M.D. On: 04/21/2017 09:34   Ct Angio Neck W Or Wo Contrast  Addendum Date: 04/21/2017   ADDENDUM REPORT: 04/21/2017 09:45 ADDENDUM: Interlobular  septal thickening in the lung apices with small pleural effusions. Consider obtaining chest radiographs to evaluate for edema/CHF. Electronically Signed   By: Sebastian Ache M.D.   On: 04/21/2017 09:45   Result Date: 04/21/2017 CLINICAL DATA:  Right-sided weakness and aphasia. EXAM: CT ANGIOGRAPHY HEAD AND NECK CT PERFUSION BRAIN TECHNIQUE: Multidetector CT imaging of the head and neck was performed using the standard protocol during bolus administration of intravenous contrast. Multiplanar CT image reconstructions and MIPs were obtained to evaluate the vascular anatomy. Carotid stenosis measurements (when applicable) are obtained utilizing NASCET criteria, using the distal internal carotid diameter as the denominator. Multiphase CT imaging of the brain  was performed following IV bolus contrast injection. Subsequent parametric perfusion maps were calculated using RAPID software. CONTRAST:  90mL ISOVUE-370 IOPAMIDOL (ISOVUE-370) INJECTION 76% COMPARISON:  None. FINDINGS: CTA NECK FINDINGS Aortic arch: Normal variant aortic arch branching pattern with common origin of the brachiocephalic and left common carotid arteries and with the left vertebral artery arising from the arch distal to the left subclavian artery. Mild arch atherosclerosis. Widely patent brachiocephalic and subclavian arteries. Right carotid system: The common carotid artery is patent proximally but demonstrates progressively diminishing contrast opacification distally without significant opacification of the carotid bifurcation. The internal carotid artery is occluded throughout the neck. No significant ECA opacification is identified either. Left carotid system: Patent without evidence of stenosis or dissection. Tortuous mid cervical ICA. Vertebral arteries: Patent without evidence of stenosis or dissection. Strongly dominant left vertebral artery. Skeleton: Moderately advanced diffuse cervical disc degeneration. Other neck: No mass or enlarged lymph  nodes. Upper chest: Motion artifact in the lung apices with bronchial wall thickening and interlobular septal thickening. Mild subpleural opacity may reflect atelectasis and/or mild fibrosis. Partially visualized small pleural effusions. Review of the MIP images confirms the above findings CTA HEAD FINDINGS Anterior circulation: The intracranial right ICA is occluded proximally with reconstitution of the supraclinoid segment via the right posterior communicating artery. A right supraclinoid ICA aneurysm projecting posteriorly measures 10 x 8 mm. 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 There is no residual opacification of this aneurysm. The intracranial left ICA is widely patent. The left cavernous ICA is tortuous with a 4 mm superiorly projecting aneurysm noted proximally. The right A1 segment is absent or occluded. The left ACA is widely patent and supplies the right. The MCAs are patent without M1 stenosis. Major M2 branches appear patent proximally, however there is a decreased number of distal left MCA branch vessels corresponding to the area of infarct demonstrated on perfusion imaging. Posterior circulation: The intracranial vertebral arteries are patent to the basilar. There is mild right V4 stenosis. A long segment fenestration is incidentally noted extending from the V3 to proximal V4 segments on the right. The basilar artery is widely patent. There is a fetal origin of the left PCA. A relatively large right posterior communicating artery is also present. No significant proximal PCA stenosis is seen. There is a variant vessel extending between the distal basilar artery and left A1 segment. No aneurysm. Venous sinuses: As permitted by contrast timing, patent. Anatomic variants: Multiple variants as detailed above. Delayed phase: Not performed. Review of the MIP images confirms  the above findings CT Brain Perfusion Findings: CBF (<30%) Volume: 35mL Perfusion (Tmax>6.0s) volume: 49mL Mismatch Volume: 14mL Infarction Location:Left frontoparietal cortex and white matter beginning at the posterior aspect of the operculum and extending superiorly. IMPRESSION: 1. Acute, small to moderate-sized left frontoparietal MCA territory infarct. 1.4 mismatch ratio. 2. No acute large vessel occlusion. Decreased number of distal left MCA branch vessels corresponding to the acute infarct. 3. 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 4. Reconstitution of the supraclinoid right ICA. 10 x 8 mm right supraclinoid ICA aneurysm. 5. 4 mm left cavernous ICA aneurysm. 6. Widely patent left common and internal carotid arteries. 7. Widely patent vertebral arteries. The study was reviewed in person with Dr. Amada Jupiter on 04/21/2017 at 8:45 a.m. Electronically Signed: By: Sebastian Ache M.D. On: 04/21/2017  09:34   Ct Cerebral Perfusion W Contrast  Addendum Date: 04/21/2017   ADDENDUM REPORT: 04/21/2017 09:45 ADDENDUM: Interlobular septal thickening in the lung apices with small pleural effusions. Consider obtaining chest radiographs to evaluate for edema/CHF. Electronically Signed   By: Sebastian AcheAllen  Grady M.D.   On: 04/21/2017 09:45   Result Date: 04/21/2017 CLINICAL DATA:  Right-sided weakness and aphasia. EXAM: CT ANGIOGRAPHY HEAD AND NECK CT PERFUSION BRAIN TECHNIQUE: Multidetector CT imaging of the head and neck was performed using the standard protocol during bolus administration of intravenous contrast. Multiplanar CT image reconstructions and MIPs were obtained to evaluate the vascular anatomy. Carotid stenosis measurements (when applicable) are obtained utilizing NASCET criteria, using the distal internal carotid diameter as the denominator. Multiphase CT imaging of the brain was performed following IV bolus contrast injection. Subsequent parametric  perfusion maps were calculated using RAPID software. CONTRAST:  90mL ISOVUE-370 IOPAMIDOL (ISOVUE-370) INJECTION 76% COMPARISON:  None. FINDINGS: CTA NECK FINDINGS Aortic arch: Normal variant aortic arch branching pattern with common origin of the brachiocephalic and left common carotid arteries and with the left vertebral artery arising from the arch distal to the left subclavian artery. Mild arch atherosclerosis. Widely patent brachiocephalic and subclavian arteries. Right carotid system: The common carotid artery is patent proximally but demonstrates progressively diminishing contrast opacification distally without significant opacification of the carotid bifurcation. The internal carotid artery is occluded throughout the neck. No significant ECA opacification is identified either. Left carotid system: Patent without evidence of stenosis or dissection. Tortuous mid cervical ICA. Vertebral arteries: Patent without evidence of stenosis or dissection. Strongly dominant left vertebral artery. Skeleton: Moderately advanced diffuse cervical disc degeneration. Other neck: No mass or enlarged lymph nodes. Upper chest: Motion artifact in the lung apices with bronchial wall thickening and interlobular septal thickening. Mild subpleural opacity may reflect atelectasis and/or mild fibrosis. Partially visualized small pleural effusions. Review of the MIP images confirms the above findings CTA HEAD FINDINGS Anterior circulation: The intracranial right ICA is occluded proximally with reconstitution of the supraclinoid segment via the right posterior communicating artery. A right supraclinoid ICA aneurysm projecting posteriorly measures 10 x 8 mm. 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 There is no residual opacification of this aneurysm. The intracranial left ICA is widely patent. The left cavernous  ICA is tortuous with a 4 mm superiorly projecting aneurysm noted proximally. The right A1 segment is absent or occluded. The left ACA is widely patent and supplies the right. The MCAs are patent without M1 stenosis. Major M2 branches appear patent proximally, however there is a decreased number of distal left MCA branch vessels corresponding to the area of infarct demonstrated on perfusion imaging. Posterior circulation: The intracranial vertebral arteries are patent to the basilar. There is mild right V4 stenosis. A long segment fenestration is incidentally noted extending from the V3 to proximal V4 segments on the right. The basilar artery is widely patent. There is a fetal origin of the left PCA. A relatively large right posterior communicating artery is also present. No significant proximal PCA stenosis is seen. There is a variant vessel extending between the distal basilar artery and left A1 segment. No aneurysm. Venous sinuses: As permitted by contrast timing, patent. Anatomic variants: Multiple variants as detailed above. Delayed phase: Not performed. Review of the MIP images confirms the above findings CT Brain Perfusion Findings: CBF (<30%) Volume: 35mL Perfusion (Tmax>6.0s) volume:  49mL Mismatch Volume: 14mL Infarction Location:Left frontoparietal cortex and white matter beginning at the posterior aspect of the operculum and extending superiorly. IMPRESSION: 1. Acute, small to moderate-sized left frontoparietal MCA territory infarct. 1.4 mismatch ratio. 2. No acute large vessel occlusion. Decreased number of distal left MCA branch vessels corresponding to the acute infarct. 3. 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 4. Reconstitution of the supraclinoid right ICA. 10 x 8 mm right supraclinoid ICA aneurysm. 5. 4 mm left cavernous ICA aneurysm. 6. Widely patent left common and internal carotid arteries. 7. Widely patent vertebral arteries. The  study was reviewed in person with Dr. Amada Jupiter on 04/21/2017 at 8:45 a.m. Electronically Signed: By: Sebastian Ache M.D. On: 04/21/2017 09:34   Dg Chest Port 1 View  Result Date: 04/21/2017 CLINICAL DATA:  CHF EXAM: PORTABLE CHEST 1 VIEW COMPARISON:  None. FINDINGS: Cardiomegaly. No overt edema. No confluent opacity or effusion. No acute bony abnormality. IMPRESSION: Cardiomegaly.  No acute findings. Electronically Signed   By: Charlett Nose M.D.   On: 04/21/2017 10:37   Ct Head Code Stroke Wo Contrast  Result Date: 04/21/2017 CLINICAL DATA:  Code stroke.  Right-sided weakness.  Aphasia. EXAM: CT HEAD WITHOUT CONTRAST TECHNIQUE: Contiguous axial images were obtained from the base of the skull through the vertex without intravenous contrast. COMPARISON:  None. FINDINGS: Brain: There is no evidence of acute large territory infarct, intracranial hemorrhage, mass, midline shift, or extra-axial fluid collection. Age related cerebral atrophy is noted. Periventricular white matter hypodensities are nonspecific but compatible with chronic small vessel ischemia, mild for age. Vascular: Calcified atherosclerosis at the skull base. 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 Skull: No fracture or focal osseous lesion. Sinuses/Orbits: Visualized paranasal sinuses and mastoid air cells are clear. No acute orbital findings. Other: None. ASPECTS [facility] Stroke Program Early CT Score) - Ganglionic level infarction (caudate, lentiform nuclei, internal capsule, insula, M1-M3 cortex): 7 - Supraganglionic infarction (M4-M6 cortex): 3 Total score (0-10 with 10 being normal): 10 IMPRESSION: 1. No evidence of acute intracranial abnormality. 2. ASPECTS is 10. 3. Mild chronic small vessel ischemic disease. 4.  Abnormal appearance of the right cavernous sinus region related to history of large ICA aneurysm status post remote balloon occlusion. These results were communicated to Dr. Amada Jupiter at 8:36 am on 04/21/2017 by text page via the [facility] messaging system. Electronically Signed   By: Sebastian Ache M.D.   On: 04/21/2017 08:40    EKG: Independently reviewed.  Sinus rhythm with increased PR interval and left bundle branch block  Assessment/Plan Principal Problem:   Acute ischemic left MCA stroke (HCC) Active Problems:   PAF (paroxysmal atrial fibrillation) (HCC)   Aortic regurgitation   Apical variant hypertrophic cardiomyopathy (HCC)   Chronic anticoagulation   Essential hypertension   Hyperlipidemia   Cataracts, bilateral   1.  Acute ischemic left MCA stroke: I have personally spoken with Dr. Amada Jupiter.  She requires an echocardiogram for the remainder of her radiological workup.  She does not require additional MRI or rotted studies as she has had a CT angiogram.  We will check an echocardiogram to ensure that there is no evidence of cardiac thrombosis.  This will assist [place] in timing when to restart her Eliquis.  Continue aspirin for  now.  We will need to further discuss with the patient's daughter is whether or not she is having difficulty obtaining her medications.  There is a report that she may be only taking her Eliquis once a day and this would need to be confirmed and family further educated.  She with profound dysarthria will need a speech therapy evaluation prior to eating.  2.  Paroxysmal atrial fibrillation: EKG currently in sinus rhythm.  Will continue home medication but will hold Eliquis.  She can get 81 mg of aspirin daily for now.  3.  Aortic regurgitation: This is noted we will check an echocardiogram today.  4.  Apical variant hypertrophic cardiomyopathy: Continue control of rate and blood pressure.  5.  Chronic anticoagulation: This is currently on hold.  Will need to  assess patient's delivery to obtain enough medication to take it correctly.  6.  Essential hypertension: Continue to monitor blood pressure closely with treat blood pressure only if it is above 220/110.  We will hold blood pressure medications for now.  7.  Hyperlipidemia: We will start atorvastatin 10 mg orally daily.  8.  Bilateral cataracts: Continue Xalatan and Cosopt drops for patient's cataracts.  9.  Possible aspiration event: Patient was found down she may have aspirated her lung examination is abnormal.  She has no fever or white blood cell count elevation.  Will monitor and recheck a chest x-ray in the a.m.  10.  Bradycardia: Patient has a long history of bradycardia and is followed primarily by Dr. Bettina Gavia from cardiology.  He had her on some as needed beta-blocker which he has since discontinued that was back in October 2018.  His notes reflect his understanding and knowledge of her bradycardia but that there is no plan to place a pacemaker at present.  It has been noted while the patient has been on telemetry here that her heart rates have dropped into the 40s.  Will monitor overnight if persists will consider cardiology consult in a.m.   DVT prophylaxis: SCDs Code Status: Do not attempt resuscitation Family Communication: Spoke with patient's daughters [name] and [name] were present for the evaluation. Disposition Plan: Likely home either with home health or outpatient physical therapy in the next 3-4 days. Consults called: Neurology Dr. Amada Jupiter Admission status: Inpatient   Lahoma Crocker MD FACP [facility] Pager [phone]  If 7PM-7AM, please contact night-coverage [email or web address] Password [email or web address]  04/21/2017, 11:18 AM

## 2017-04-21 NOTE — ED Notes (Signed)
Portable xray at bedside.

## 2017-04-22 ENCOUNTER — Inpatient Hospital Stay (HOSPITAL_COMMUNITY): Payer: PPO

## 2017-04-22 DIAGNOSIS — I351 Nonrheumatic aortic (valve) insufficiency: Secondary | ICD-10-CM

## 2017-04-22 DIAGNOSIS — I10 Essential (primary) hypertension: Secondary | ICD-10-CM

## 2017-04-22 DIAGNOSIS — I361 Nonrheumatic tricuspid (valve) insufficiency: Secondary | ICD-10-CM

## 2017-04-22 DIAGNOSIS — E785 Hyperlipidemia, unspecified: Secondary | ICD-10-CM

## 2017-04-22 LAB — ECHOCARDIOGRAM COMPLETE
HEIGHTINCHES: 64 in
WEIGHTICAEL: 2257.51 [oz_av]

## 2017-04-22 MED ORDER — LORAZEPAM 2 MG/ML IJ SOLN
0.5000 mg | Freq: Once | INTRAMUSCULAR | Status: AC
  Administered 2017-04-22: 0.5 mg via INTRAVENOUS
  Filled 2017-04-22: qty 1

## 2017-04-22 MED ORDER — ASPIRIN EC 325 MG PO TBEC: 325.0000 mg | DELAYED_RELEASE_TABLET | Freq: Every day | ORAL | Status: DC

## 2017-04-22 MED ORDER — PERFLUTREN LIPID MICROSPHERE
1.0000 mL | INTRAVENOUS | Status: AC | PRN
  Administered 2017-04-22: 2 mL via INTRAVENOUS
  Filled 2017-04-22: qty 10

## 2017-04-22 MED ORDER — METOPROLOL TARTRATE 5 MG/5ML IV SOLN
5.0000 mg | Freq: Once | INTRAVENOUS | Status: AC
  Administered 2017-04-22: 5 mg via INTRAVENOUS
  Filled 2017-04-22: qty 5

## 2017-04-22 MED ORDER — ASPIRIN 300 MG RE SUPP
300.0000 mg | Freq: Every day | RECTAL | Status: DC
  Administered 2017-04-23: 300 mg via RECTAL
  Filled 2017-04-22: qty 1

## 2017-04-22 NOTE — Progress Notes (Signed)
  Echocardiogram 2D Echocardiogram has been performed.  Courtney SavoyCasey N Amerigo Reyes 04/22/2017, 9:49 AM

## 2017-04-22 NOTE — Progress Notes (Signed)
SLP Cancellation Note  Patient Details Name: Courtney Reyes MRN: [id number] DOB: 01/21/1925   Cancelled treatment:    SLP arrived to complete cog/com evaluation, however, pt is curently off unit for MRI. MBS also scheduled this morning. Report to follow.   Reason Eval/Treat Not Completed: Patient at procedure or test/unavailable  Celia B. Murvin NatalBueche, [facility] Lans[facility] Asc Partners LLCMSP, CCC-SLP Speech Language Pathologist [phone]  Leigh AuroraBueche, Celia Brown 04/22/2017, 10:39 AM

## 2017-04-22 NOTE — Evaluation (Signed)
Speech Language Pathology Evaluation Patient Details Name: Courtney Reyes MRN: [id number] DOB: 02/12/1924 Today's Date: 04/22/2017 Time: 1050-1110 SLP Time Calculation (min) (ACUTE ONLY): 20 min  Problem List:  Patient Active Problem List   Diagnosis Date Noted  . Acute ischemic left MCA stroke (HCC) 04/21/2017  . Essential hypertension 04/21/2017  . Hyperlipidemia 04/21/2017  . Cataracts, bilateral   . Cerebral aneurysm   . Hypercholesteremia   . Hypertension   . Osteoarthritis   . Chronic anticoagulation 01/01/2016  . PAF (paroxysmal atrial fibrillation) (HCC) 03/23/2015  . Aortic regurgitation 03/16/2015  . Apical variant hypertrophic cardiomyopathy (HCC) 03/16/2015  . Bradycardia 03/15/2015   Past Medical History:  Past Medical History:  Diagnosis Date  . Aortic regurgitation 03/16/2015  . Apical variant hypertrophic cardiomyopathy (HCC) 03/16/2015  . Cataracts, bilateral   . Cerebral aneurysm   . Hypercholesteremia   . Hypertension   . Osteoarthritis   . PAF (paroxysmal atrial fibrillation) (HCC) 03/23/2015   Past Surgical History: History reviewed. No pertinent surgical history. HPI:  Courtney I Holleyis a 84 y.o.femalewith medical history significantfor hypertension, hyperlipidemia, atrial fibrillation on Eliquis, history of hypertrophic cardiomyopathy and aortic regurgitation who presented to our emergency department this morning with right-sided weakness and dysarthria. Stroke head CT shows no acute findings CTA of the head showed no large vessel occlusion but an acute moderate sized left frontoparietal MCA territory infarction.   Assessment / Plan / Recommendation Clinical Impression  Pt presents with severe-profound receptive and expressive aphasia. She is unable to answer yes/no questions or follow simple directions, even with max visual and tactile cues/gestures. Expressively, pt demonstrates clear vocalization, however, no true words were elicited. Pt unable to repeat  sounds or gestures, even given max visual and tactile cues/gestures. Pt exhibits a strong left gaze preference, but is able to turn head to midline with tactile, verbal, and visual cues. Recommend continued ST intervention to maximize communicative effectiveness and decrease caregiver burden.     SLP Assessment  SLP Recommendation/Assessment: Patient needs continued Speech Lanaguage Pathology Services SLP Visit Diagnosis: Aphasia (R47.01)    Follow Up Recommendations  24 hour supervision/assistance;Skilled Nursing facility    Frequency and Duration min 2x/week  2 weeks      SLP Evaluation Cognition  Overall Cognitive Status: No family/caregiver present to determine baseline cognitive functioning Arousal/Alertness: Awake/alert Orientation Level: Disoriented X4       Comprehension  Auditory Comprehension Overall Auditory Comprehension: Impaired Yes/No Questions: Impaired Basic Biographical Questions: 0-25% accurate Basic Immediate Environment Questions: 0-24% accurate Commands: Impaired One Step Basic Commands: 0-24% accurate Two Step Basic Commands: 0-24% accurate Conversation: Simple EffectiveTechniques: Visual/Gestural cues Visual Recognition/Discrimination Discrimination: Not tested Reading Comprehension Reading Status: Impaired(pt unable to demonstrate comprehension of written information) Interfering Components: Right neglect/inattention    Expression Expression Primary Mode of Expression: Verbal Verbal Expression Overall Verbal Expression: Impaired Initiation: (Pt intermittently vocalizes. No true words) Repetition: Impaired Level of Impairment: Word level Naming: Impairment Responsive: 0-25% accurate Confrontation: Impaired Convergent: 0-24% accurate Divergent: 0-24% accurate Written Expression Dominant Hand: Right Written Expression: Unable to assess (comment)   Oral / Motor  Oral Motor/Sensory Function Overall Oral Motor/Sensory Function: Moderate  impairment Facial ROM: Reduced right;Suspected CN VII (facial) dysfunction Facial Symmetry: Abnormal symmetry right;Suspected CN VII (facial) dysfunction Facial Strength: Reduced right;Suspected CN VII (facial) dysfunction Lingual ROM: Other (Comment)(minimal lingual manipulation of boluses) Velum: Within Functional Limits Mandible: Within Functional Limits Motor Speech Overall Motor Speech: Impaired Respiration: Within functional limits Phonation: Normal Articulation: Impaired Level of Impairment:  Word Intelligibility: (no functional speech elicited)   GO                   Celia B. Murvin Natal [facility], CCC-SLP Speech Language Pathologist [phone]  Courtney Reyes 04/22/2017, 12:19 PM

## 2017-04-22 NOTE — Progress Notes (Signed)
PROGRESS NOTE    Courtney Reyes  UJW:[id number] DOB: 1924/04/16 DOA: 04/21/2017 PCP: Renford Dills, MD   Outpatient Specialists:     Brief Narrative:  Courtney Reyes is a 84 y.o. female with medical history significant for hypertension, hyperlipidemia, atrial fibrillation on Eliquis, history of hypertrophic cardiomyopathy and aortic regurgitation who presented to our emergency department this morning with right-sided weakness and dysarthria.  Patient's daughter had checked on her at 7 AM and spoke to her daughter from the bed.  At 7:40 AM the daughter heard the patient fall out of her bed she went to the bedroom and found her to be on the floor with right-sided weakness and aphasia.  EMS was called and patient was transported to the emergency department.  Stroke head CT shows no acute findings CTA of the head showed no large vessel occlusion but an acute moderate sized left frontoparietal MCA territory infarction.  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  Constitution of the supraclinoid right ICA 10 x 8 mm right supraclinoid ICA aneurysm.  4 mm left cavernous ICA aneurysm.  Widely patent left common and internal carotid arteries.  Widely patent vertebral arteries.  Usually the patient lives in her home when she is there there is a daughter, [name], who stays with her.  On other occasions she stays with her daughter [name].  He was at her home with [name] this morning when she had her event.  The patient walks with a walker but baize dresses feeds herself.  Her daughter does her bills but has always done that.  Over the past 6 months family notes worsening memory problems.  Neurology reports that the patient was ordered Eliquis twice daily but is apparently taking it only once daily because of financial concerns.  This was noted in 1 of the cardiology records from Dr. Michaelle Copas office in April 2017.  Initially on my evaluation patient  had a left gaze preference but during the examination and my time in the room with her she started looking over to the right side.  Her speech remains very garbled difficult to understand but some things are intelligible.  She has a dense right hemiparesis.  She will be admitted onto a neuro telemetry bed for further evaluation and management of moderate left MCA territory stroke.     Assessment & Plan:   Principal Problem:   Acute ischemic left MCA stroke [facility]) Active Problems:   Aortic regurgitation   Apical variant hypertrophic cardiomyopathy (HCC)   PAF (paroxysmal atrial fibrillation) (HCC)   Chronic anticoagulation   Essential hypertension   Hyperlipidemia   Cataracts, bilateral     Acute ischemic left MCA stroke:  -echocardiogram  -SLP eval pending- was non-cooperative today -once passes swallow eval will need resumption of eliquis -neuro consult appreciated -poor overall prognosis-- discussed with neuro-- palliative care consult-- family in room adamant that she would want to be at home -if spike temp, may need coverage for aspiration  Paroxysmal atrial fibrillation:  -resume eliquis when able  H/o Aortic regurgitation:  -echo pending  Essential hypertension:  -permissive HTN  Hyperlipidemia:  -statin  Bilateral cataracts:  -Continue Xalatan and Cosopt drops      DVT prophylaxis:   SCD's  Code Status: DNR   Family Communication: Family in room  Disposition Plan:     Consultants:  Neuro Palliative care  Subjective: Back from SLP/MRI  Objective: Vitals:   04/22/17 0500 04/22/17 0600  04/22/17 1003 04/22/17 1238  BP:    130/63  Pulse: 69 70 69 (!) 57  Resp: 20 (!) 21    Temp:   98.5 F (36.9 C) 98.3 F (36.8 C)  TempSrc:   Axillary Axillary  SpO2: 93% 93% 96% 96%  Weight:      Height:        Intake/Output Summary (Last 24 hours) at 04/22/2017 1254 Last data filed at 04/22/2017 0600 Gross per 24 hour  Intake 1156.25 ml    Output -  Net 1156.25 ml   Filed Weights   04/21/17 0857  Weight: 64 kg (141 lb 1.5 oz)    Examination:  General exam: ill appearing, somnolent  Respiratory system: no increase work of breathing Cardiovascular system: rrr Gastrointestinal system: +BS, soft Central nervous system: not cooperative     Data Reviewed: I have personally reviewed following labs and imaging studies  CBC: Recent Labs  Lab 04/21/17 0813 04/21/17 0822  WBC 4.3  --   NEUTROABS 1.7  --   HGB 11.8* 12.2  HCT 36.2 36.0  MCV 96.3  --   PLT 208  --    Basic Metabolic Panel: Recent Labs  Lab 04/21/17 0813 04/21/17 0822  NA 139 142  K 3.7 3.6  CL 106 106  CO2 23  --   GLUCOSE 130* 127*  BUN 15 17  CREATININE 0.97 0.90  CALCIUM 8.9  --    GFR: Estimated Creatinine Clearance: 35.9 mL/min (by C-G formula based on SCr of 0.9 mg/dL). Liver Function Tests: Recent Labs  Lab 04/21/17 0813  AST 25  ALT 10*  ALKPHOS 70  BILITOT 0.6  PROT 6.7  ALBUMIN 2.9*   No results for input(s): LIPASE, AMYLASE in the last 168 hours. No results for input(s): AMMONIA in the last 168 hours. Coagulation Profile: Recent Labs  Lab 04/21/17 0813  INR 1.18   Cardiac Enzymes: No results for input(s): CKTOTAL, CKMB, CKMBINDEX, TROPONINI in the last 168 hours. BNP (last 3 results) No results for input(s): PROBNP in the last 8760 hours. HbA1C: Recent Labs    04/21/17 0929  HGBA1C 6.0*   CBG: Recent Labs  Lab 04/21/17 0810  GLUCAP 115*   Lipid Profile: Recent Labs    04/21/17 0929  CHOL 158  HDL 51  LDLCALC 98  TRIG 46  CHOLHDL 3.1   Thyroid Function Tests: No results for input(s): TSH, T4TOTAL, FREET4, T3FREE, THYROIDAB in the last 72 hours. Anemia Panel: No results for input(s): VITAMINB12, FOLATE, FERRITIN, TIBC, IRON, RETICCTPCT in the last 72 hours. Urine analysis: No results found for: COLORURINE, APPEARANCEUR, LABSPEC, PHURINE, GLUCOSEU, HGBUR, BILIRUBINUR, KETONESUR, PROTEINUR,  UROBILINOGEN, NITRITE, LEUKOCYTESUR  )No results found for this or any previous visit (from the past 240 hour(s)).    Anti-infectives (From admission, onward)   None       Radiology Studies: Ct Angio Head W Or Wo Contrast  Addendum Date: 04/21/2017   ADDENDUM REPORT: 04/21/2017 09:45 ADDENDUM: Interlobular septal thickening in the lung apices with small pleural effusions. Consider obtaining chest radiographs to evaluate for edema/CHF. Electronically Signed   By: Sebastian Ache M.D.   On: 04/21/2017 09:45   Result Date: 04/21/2017 CLINICAL DATA:  Right-sided weakness and aphasia. EXAM: CT ANGIOGRAPHY HEAD AND NECK CT PERFUSION BRAIN TECHNIQUE: Multidetector CT imaging of the head and neck was performed using the standard protocol during bolus administration of intravenous contrast. Multiplanar CT image reconstructions and MIPs were obtained to evaluate the vascular anatomy. Carotid stenosis  measurements (when applicable) are obtained utilizing NASCET criteria, using the distal internal carotid diameter as the denominator. Multiphase CT imaging of the brain was performed following IV bolus contrast injection. Subsequent parametric perfusion maps were calculated using RAPID software. CONTRAST:  90mL ISOVUE-370 IOPAMIDOL (ISOVUE-370) INJECTION 76% COMPARISON:  None. FINDINGS: CTA NECK FINDINGS Aortic arch: Normal variant aortic arch branching pattern with common origin of the brachiocephalic and left common carotid arteries and with the left vertebral artery arising from the arch distal to the left subclavian artery. Mild arch atherosclerosis. Widely patent brachiocephalic and subclavian arteries. Right carotid system: The common carotid artery is patent proximally but demonstrates progressively diminishing contrast opacification distally without significant opacification of the carotid bifurcation. The internal carotid artery is occluded throughout the neck. No significant ECA opacification is identified  either. Left carotid system: Patent without evidence of stenosis or dissection. Tortuous mid cervical ICA. Vertebral arteries: Patent without evidence of stenosis or dissection. Strongly dominant left vertebral artery. Skeleton: Moderately advanced diffuse cervical disc degeneration. Other neck: No mass or enlarged lymph nodes. Upper chest: Motion artifact in the lung apices with bronchial wall thickening and interlobular septal thickening. Mild subpleural opacity may reflect atelectasis and/or mild fibrosis. Partially visualized small pleural effusions. Review of the MIP images confirms the above findings CTA HEAD FINDINGS Anterior circulation: The intracranial right ICA is occluded proximally with reconstitution of the supraclinoid segment via the right posterior communicating artery. A right supraclinoid ICA aneurysm projecting posteriorly measures 10 x 8 mm. 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 There is no residual opacification of this aneurysm. The intracranial left ICA is widely patent. The left cavernous ICA is tortuous with a 4 mm superiorly projecting aneurysm noted proximally. The right A1 segment is absent or occluded. The left ACA is widely patent and supplies the right. The MCAs are patent without M1 stenosis. Major M2 branches appear patent proximally, however there is a decreased number of distal left MCA branch vessels corresponding to the area of infarct demonstrated on perfusion imaging. Posterior circulation: The intracranial vertebral arteries are patent to the basilar. There is mild right V4 stenosis. A long segment fenestration is incidentally noted extending from the V3 to proximal V4 segments on the right. The basilar artery is widely patent. There is a fetal origin of the left PCA. A relatively large right posterior communicating artery is also present. No  significant proximal PCA stenosis is seen. There is a variant vessel extending between the distal basilar artery and left A1 segment. No aneurysm. Venous sinuses: As permitted by contrast timing, patent. Anatomic variants: Multiple variants as detailed above. Delayed phase: Not performed. Review of the MIP images confirms the above findings CT Brain Perfusion Findings: CBF (<30%) Volume: 35mL Perfusion (Tmax>6.0s) volume: 49mL Mismatch Volume: 14mL Infarction Location:Left frontoparietal cortex and white matter beginning at the posterior aspect of the operculum and extending superiorly. IMPRESSION: 1. Acute, small to moderate-sized left frontoparietal MCA territory infarct. 1.4 mismatch ratio. 2. No acute large vessel occlusion. Decreased number of distal left MCA branch vessels corresponding to the acute infarct. 3. 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 4. Reconstitution of the supraclinoid right ICA. 10 x 8 mm right supraclinoid ICA aneurysm. 5. 4 mm left cavernous ICA aneurysm. 6. Widely patent left common and internal carotid arteries. 7. Widely patent vertebral arteries.  The study was reviewed in person with Dr. Amada Jupiter on 04/21/2017 at 8:45 a.m. Electronically Signed: By: Sebastian Ache M.D. On: 04/21/2017 09:34   Ct Angio Neck W Or Wo Contrast  Addendum Date: 04/21/2017   ADDENDUM REPORT: 04/21/2017 09:45 ADDENDUM: Interlobular septal thickening in the lung apices with small pleural effusions. Consider obtaining chest radiographs to evaluate for edema/CHF. Electronically Signed   By: Sebastian Ache M.D.   On: 04/21/2017 09:45   Result Date: 04/21/2017 CLINICAL DATA:  Right-sided weakness and aphasia. EXAM: CT ANGIOGRAPHY HEAD AND NECK CT PERFUSION BRAIN TECHNIQUE: Multidetector CT imaging of the head and neck was performed using the standard protocol during bolus administration of intravenous contrast. Multiplanar CT image  reconstructions and MIPs were obtained to evaluate the vascular anatomy. Carotid stenosis measurements (when applicable) are obtained utilizing NASCET criteria, using the distal internal carotid diameter as the denominator. Multiphase CT imaging of the brain was performed following IV bolus contrast injection. Subsequent parametric perfusion maps were calculated using RAPID software. CONTRAST:  90mL ISOVUE-370 IOPAMIDOL (ISOVUE-370) INJECTION 76% COMPARISON:  None. FINDINGS: CTA NECK FINDINGS Aortic arch: Normal variant aortic arch branching pattern with common origin of the brachiocephalic and left common carotid arteries and with the left vertebral artery arising from the arch distal to the left subclavian artery. Mild arch atherosclerosis. Widely patent brachiocephalic and subclavian arteries. Right carotid system: The common carotid artery is patent proximally but demonstrates progressively diminishing contrast opacification distally without significant opacification of the carotid bifurcation. The internal carotid artery is occluded throughout the neck. No significant ECA opacification is identified either. Left carotid system: Patent without evidence of stenosis or dissection. Tortuous mid cervical ICA. Vertebral arteries: Patent without evidence of stenosis or dissection. Strongly dominant left vertebral artery. Skeleton: Moderately advanced diffuse cervical disc degeneration. Other neck: No mass or enlarged lymph nodes. Upper chest: Motion artifact in the lung apices with bronchial wall thickening and interlobular septal thickening. Mild subpleural opacity may reflect atelectasis and/or mild fibrosis. Partially visualized small pleural effusions. Review of the MIP images confirms the above findings CTA HEAD FINDINGS Anterior circulation: The intracranial right ICA is occluded proximally with reconstitution of the supraclinoid segment via the right posterior communicating artery. A right supraclinoid ICA  aneurysm projecting posteriorly measures 10 x 8 mm. 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 There is no residual opacification of this aneurysm. The intracranial left ICA is widely patent. The left cavernous ICA is tortuous with a 4 mm superiorly projecting aneurysm noted proximally. The right A1 segment is absent or occluded. The left ACA is widely patent and supplies the right. The MCAs are patent without M1 stenosis. Major M2 branches appear patent proximally, however there is a decreased number of distal left MCA branch vessels corresponding to the area of infarct demonstrated on perfusion imaging. Posterior circulation: The intracranial vertebral arteries are patent to the basilar. There is mild right V4 stenosis. A long segment fenestration is incidentally noted extending from the V3 to proximal V4 segments on the right. The basilar artery is widely patent. There is a fetal origin of the left PCA. A relatively large right posterior communicating artery is also present. No significant proximal PCA stenosis is seen. There is a variant vessel extending between the distal basilar artery and left A1 segment. No aneurysm. Venous sinuses: As permitted by contrast timing, patent. Anatomic variants: Multiple variants as detailed  above. Delayed phase: Not performed. Review of the MIP images confirms the above findings CT Brain Perfusion Findings: CBF (<30%) Volume: 35mL Perfusion (Tmax>6.0s) volume: 49mL Mismatch Volume: 14mL Infarction Location:Left frontoparietal cortex and white matter beginning at the posterior aspect of the operculum and extending superiorly. IMPRESSION: 1. Acute, small to moderate-sized left frontoparietal MCA territory infarct. 1.4 mismatch ratio. 2. No acute large vessel occlusion. Decreased number of distal left MCA branch vessels corresponding to the acute infarct.  3. 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 4. Reconstitution of the supraclinoid right ICA. 10 x 8 mm right supraclinoid ICA aneurysm. 5. 4 mm left cavernous ICA aneurysm. 6. Widely patent left common and internal carotid arteries. 7. Widely patent vertebral arteries. The study was reviewed in person with Dr. Amada JupiterKirkpatrick on 04/21/2017 at 8:45 a.m. Electronically Signed: By: Sebastian AcheAllen  Grady M.D. On: 04/21/2017 09:34   Mr Brain Wo Contrast  Result Date: 04/22/2017 CLINICAL DATA:  Stroke, right-sided weakness EXAM: MRI HEAD WITHOUT CONTRAST TECHNIQUE: Multiplanar, multiecho pulse sequences of the brain and surrounding structures were obtained without intravenous contrast. COMPARISON:  CTA 04/21/2017 FINDINGS: Brain: Acute infarct left parietal lobe corresponds closely to the CT perfusion abnormality. No acute infarct on the right. Mild atrophy.  Negative for hydrocephalus.  Negative for hemorrhage. Image quality degraded by motion. Septated cystic lesion in the right cavernous chronic sinus measures 24 x 21 mm. This does not show enhancement on the CT Vascular: Right internal carotid artery is occluded with loss of flow void. 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 Otherwise normal flow voids. Skull and upper cervical spine: Negative Sinuses/Orbits: Paranasal sinuses clear.  Bilateral cataract removal Other: None IMPRESSION: Acute infarct left parietal lobe, this corresponds closely to the CT perfusion abnormality. No other acute infarct Mild generalized atrophy. 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 24 x 21 mm cyst in the right cavernous sinus. There appears to be some remodeling of the skull base on CT in this area. Possible arachnoid cyst Electronically Signed   By: Marlan Palauharles  Clark M.D.   On: 04/22/2017 12:45   Ct Cerebral Perfusion W  Contrast  Addendum Date: 04/21/2017   ADDENDUM REPORT: 04/21/2017 09:45 ADDENDUM: Interlobular septal thickening in the lung apices with small pleural effusions. Consider obtaining chest radiographs to evaluate for edema/CHF. Electronically Signed   By: Sebastian AcheAllen  Grady M.D.   On: 04/21/2017 09:45   Result Date: 04/21/2017 CLINICAL DATA:  Right-sided weakness and aphasia. EXAM: CT ANGIOGRAPHY HEAD AND NECK CT PERFUSION BRAIN TECHNIQUE: Multidetector CT imaging of the head and neck was performed using the standard protocol during bolus administration of intravenous contrast. Multiplanar CT image reconstructions and MIPs were obtained to evaluate the vascular anatomy. Carotid stenosis measurements (when applicable) are obtained utilizing NASCET criteria, using the distal internal carotid diameter as the denominator. Multiphase CT imaging of the brain was performed following IV bolus contrast injection. Subsequent parametric perfusion maps were calculated using RAPID software. CONTRAST:  90mL ISOVUE-370 IOPAMIDOL (ISOVUE-370) INJECTION 76% COMPARISON:  None. FINDINGS: CTA NECK FINDINGS Aortic arch: Normal variant aortic arch branching pattern with common origin of the brachiocephalic and left common carotid arteries and with the left vertebral artery arising from the arch distal to the left subclavian artery. Mild arch atherosclerosis. Widely patent brachiocephalic and subclavian arteries. Right carotid system: The common carotid artery is patent proximally but demonstrates  progressively diminishing contrast opacification distally without significant opacification of the carotid bifurcation. The internal carotid artery is occluded throughout the neck. No significant ECA opacification is identified either. Left carotid system: Patent without evidence of stenosis or dissection. Tortuous mid cervical ICA. Vertebral arteries: Patent without evidence of stenosis or dissection. Strongly dominant left vertebral artery.  Skeleton: Moderately advanced diffuse cervical disc degeneration. Other neck: No mass or enlarged lymph nodes. Upper chest: Motion artifact in the lung apices with bronchial wall thickening and interlobular septal thickening. Mild subpleural opacity may reflect atelectasis and/or mild fibrosis. Partially visualized small pleural effusions. Review of the MIP images confirms the above findings CTA HEAD FINDINGS Anterior circulation: The intracranial right ICA is occluded proximally with reconstitution of the supraclinoid segment via the right posterior communicating artery. A right supraclinoid ICA aneurysm projecting posteriorly measures 10 x 8 mm. 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 There is no residual opacification of this aneurysm. The intracranial left ICA is widely patent. The left cavernous ICA is tortuous with a 4 mm superiorly projecting aneurysm noted proximally. The right A1 segment is absent or occluded. The left ACA is widely patent and supplies the right. The MCAs are patent without M1 stenosis. Major M2 branches appear patent proximally, however there is a decreased number of distal left MCA branch vessels corresponding to the area of infarct demonstrated on perfusion imaging. Posterior circulation: The intracranial vertebral arteries are patent to the basilar. There is mild right V4 stenosis. A long segment fenestration is incidentally noted extending from the V3 to proximal V4 segments on the right. The basilar artery is widely patent. There is a fetal origin of the left PCA. A relatively large right posterior communicating artery is also present. No significant proximal PCA stenosis is seen. There is a variant vessel extending between the distal basilar artery and left A1 segment. No aneurysm. Venous sinuses: As permitted by contrast timing, patent. Anatomic  variants: Multiple variants as detailed above. Delayed phase: Not performed. Review of the MIP images confirms the above findings CT Brain Perfusion Findings: CBF (<30%) Volume: 35mL Perfusion (Tmax>6.0s) volume: 49mL Mismatch Volume: 14mL Infarction Location:Left frontoparietal cortex and white matter beginning at the posterior aspect of the operculum and extending superiorly. IMPRESSION: 1. Acute, small to moderate-sized left frontoparietal MCA territory infarct. 1.4 mismatch ratio. 2. No acute large vessel occlusion. Decreased number of distal left MCA branch vessels corresponding to the acute infarct. 3. 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 4. Reconstitution of the supraclinoid right ICA. 10 x 8 mm right supraclinoid ICA aneurysm. 5. 4 mm left cavernous ICA aneurysm. 6. Widely patent left common and internal carotid arteries. 7. Widely patent vertebral arteries. The study was reviewed in person with Dr. Amada Jupiter on 04/21/2017 at 8:45 a.m. Electronically Signed: By: Sebastian Ache M.D. On: 04/21/2017 09:34   Dg Chest Port 1 View  Result Date: 04/22/2017 CLINICAL DATA:  Short of breath EXAM: PORTABLE CHEST 1 VIEW COMPARISON:  04/21/2017 FINDINGS: The heart remains severely enlarged. The thorax is rotated. Evaluation of the mediastinum is limited. Prominence of the central vessels is again noted. Peripheral pulmonary vascularity is within normal limits. No pneumothorax. No pleural effusion. Lungs are grossly clear allowing for rotation. IMPRESSION: Stable cardiomegaly without decompensation. Electronically Signed   By: Jolaine Click M.D.   On: 04/22/2017  07:56   Dg Chest Port 1 View  Result Date: 04/21/2017 CLINICAL DATA:  CHF EXAM: PORTABLE CHEST 1 VIEW COMPARISON:  None. FINDINGS: Cardiomegaly. No overt edema. No confluent opacity or effusion. No acute bony abnormality. IMPRESSION: Cardiomegaly.  No acute findings. Electronically Signed    By: Charlett Nose M.D.   On: 04/21/2017 10:37   Ct Head Code Stroke Wo Contrast  Result Date: 04/21/2017 CLINICAL DATA:  Code stroke.  Right-sided weakness.  Aphasia. EXAM: CT HEAD WITHOUT CONTRAST TECHNIQUE: Contiguous axial images were obtained from the base of the skull through the vertex without intravenous contrast. COMPARISON:  None. FINDINGS: Brain: There is no evidence of acute large territory infarct, intracranial hemorrhage, mass, midline shift, or extra-axial fluid collection. Age related cerebral atrophy is noted. Periventricular white matter hypodensities are nonspecific but compatible with chronic small vessel ischemia, mild for age. Vascular: Calcified atherosclerosis at the skull base. 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 Skull: No fracture or focal osseous lesion. Sinuses/Orbits: Visualized paranasal sinuses and mastoid air cells are clear. No acute orbital findings. Other: None. ASPECTS [facility] Early CT Score) - Ganglionic level infarction (caudate, lentiform nuclei, internal capsule, insula, M1-M3 cortex): 7 - Supraganglionic infarction (M4-M6 cortex): 3 Total score (0-10 with 10 being normal): 10 IMPRESSION: 1. No evidence of acute intracranial abnormality. 2. ASPECTS is 10. 3. Mild chronic small vessel ischemic disease. 4. Abnormal appearance of the right cavernous sinus region related to history of large ICA aneurysm status post remote balloon occlusion. These results were communicated to Dr. Amada Jupiter at 8:36 am on 04/21/2017 by text page via the [facility] messaging system. Electronically Signed   By: Sebastian Ache M.D.   On: 04/21/2017 08:40        Scheduled Meds: . aspirin EC  325 mg Oral Daily   Or  . aspirin  300 mg Rectal Daily  .  dorzolamide-timolol  1 drop Both Eyes BID  . latanoprost  1 drop Both Eyes QHS   Continuous Infusions: . sodium chloride 75 mL/hr at 04/22/17 0417     LOS: 1 day    Time spent: 35 min    Joseph Art, DO [facility] Pager [phone]  If 7PM-7AM, please contact night-coverage [email or web address] Password [facility] 04/22/2017, 12:54 PM

## 2017-04-22 NOTE — Progress Notes (Addendum)
STROKE DAILY PROGRESS NOTE   HPI   Courtney Reyes is an 84 y.o. female PMH of HTN, HLD, AFIB on Eliquis, Hx of hypertrophic cardiomyopathy, and aortic regurgitation who presents with acute onset Right sided weakness and aphasia. Family reports patient woke up at approximately 7 AM, spoke to her daughter from her bed. At 7:40 AM daughter heard patient fall out of her bed, she had right sided weakness and aphasia, EMS was called and patient transported to ED. Code Stroke Head CT has no acute findings. CTA shows no  LVO and Acute, small to moderate-sized left frontoparietal MCA territory infarct. 1.4 mismatch ratio.   Per family at baseline: walks with walker, some mild memory problems that have worsened over the past 6 months  Found in Cardiology office note - Dr Katrinka Blazing 05/2015 Apixaban 2.5 mg twice a day but the patient is only taking it once a day because of economic concerns  Date last known well: Date: 04/21/2017 Time last known well: Time: 07:40 tPA Given: No: Patient taking Eliquis Modified Rankin: Rankin Score=3 NIH Stroke Scale= 21  Subjective  Daughter and granddaughter at bedside. Patient found in bed in NAD. Very somnolent and only briefly opens eyes to voice. Reports of sundowning overnight and one dose of 0.5mg  Ativan given. MBS results - recommend NPO. Long discussion with daughter about long term prognosis and quality of life. Palliative care consultation placed for goals of care. Family states patient would not wanted a long term feeding tube.   Physical Examination   Vitals:   04/22/17 0500 04/22/17 0600 04/22/17 1003 04/22/17 1238  BP:    130/63  Pulse: 69 70 69 (!) 57  Resp: 20 (!) 21    Temp:   98.5 F (36.9 C) 98.3 F (36.8 C)  TempSrc:   Axillary Axillary  SpO2: 93% 93% 96% 96%  Weight:      Height:       HEENT-  Normocephalic,  Cardiovascular - Regular rate and rhythm  Respiratory -  decreased BS bilaterally. Slightly labored breathing, + expirataory wheezing. Abdomen - soft and non-tender, BS normal Extremities- no edema or cyanosis Skin-warm and dry  Neurological Examination  Mental Status: somnolent, briefly opens eyes to voice. Globally aphasic. When she attempts to speak her speech is moderately  dysarthric, intermittently able to follow some simple commands, +Right sided neglect.  Cranial Nerves: II: Pupils are equal, round, and sluggishly reactive to light. No blink to threat on Right. III,IV, VI: EOMI in Left eye and able to cross midline. Right eye has limited movement - unsure if this is a baseline finding. +ptosis of Left eye is a baseline finding per family. No nystagmus noted on exam.   V: Facial sensation is extinct on right VII: Facial movement is decreased on right VIII: hearing is intact, however, patient is extremely hard of hearing X: Uvula appears to elevate symmetrically XI: Shoulder shrug is weak on right XII: tongue appears midline without atrophy or fasciculations.  Motor: Tone is normal. Bulk is normal.  4/5 strength on left - likely baseline finding, 0/5 on right UE/LE Sensor: Sensation is extinct on right side. Deep Tendon Reflexes: 2+ and symmetric throughout in the biceps and patellae Plantars: Toes are downgoing bilaterally.  Cerebellar: Unable to perform on right Gait: not tested  Laboratory Results  CBC: Recent Labs  Lab 04/21/17 0813 04/21/17 0822  WBC 4.3  --   HGB 11.8* 12.2  HCT 36.2 36.0  MCV 96.3  --  PLT 208  --    Basic Metabolic Panel: Recent Labs  Lab 04/21/17 0813 04/21/17 0822  NA 139 142  K 3.7 3.6  CL 106 106  CO2 23  --   GLUCOSE 130* 127*  BUN 15 17  CREATININE 0.97 0.90  CALCIUM 8.9  --    Coagulation Studies: Recent Labs    04/21/17 0813  APTT 30  INR 1.18   Imaging Results  Ct Angio Head W Or Wo Contrast  Addendum Date: 04/21/2017   ADDENDUM REPORT: 04/21/2017 09:45 ADDENDUM:  Interlobular septal thickening in the lung apices with small pleural effusions. Consider obtaining chest radiographs to evaluate for edema/CHF. Electronically Signed   By: Sebastian Ache M.D.   On: 04/21/2017 09:45   Result Date: 04/21/2017 CLINICAL DATA:  Right-sided weakness and aphasia. EXAM: CT ANGIOGRAPHY HEAD AND NECK CT PERFUSION BRAIN TECHNIQUE: Multidetector CT imaging of the head and neck was performed using the standard protocol during bolus administration of intravenous contrast. Multiplanar CT image reconstructions and MIPs were obtained to evaluate the vascular anatomy. Carotid stenosis measurements (when applicable) are obtained utilizing NASCET criteria, using the distal internal carotid diameter as the denominator. Multiphase CT imaging of the brain was performed following IV bolus contrast injection. Subsequent parametric perfusion maps were calculated using RAPID software. CONTRAST:  90mL ISOVUE-370 IOPAMIDOL (ISOVUE-370) INJECTION 76% COMPARISON:  None. FINDINGS: CTA NECK FINDINGS Aortic arch: Normal variant aortic arch branching pattern with common origin of the brachiocephalic and left common carotid arteries and with the left vertebral artery arising from the arch distal to the left subclavian artery. Mild arch atherosclerosis. Widely patent brachiocephalic and subclavian arteries. Right carotid system: The common carotid artery is patent proximally but demonstrates progressively diminishing contrast opacification distally without significant opacification of the carotid bifurcation. The internal carotid artery is occluded throughout the neck. No significant ECA opacification is identified either. Left carotid system: Patent without evidence of stenosis or dissection. Tortuous mid cervical ICA. Vertebral arteries: Patent without evidence of stenosis or dissection. Strongly dominant left vertebral artery. Skeleton: Moderately advanced diffuse cervical disc degeneration. Other neck: No mass or  enlarged lymph nodes. Upper chest: Motion artifact in the lung apices with bronchial wall thickening and interlobular septal thickening. Mild subpleural opacity may reflect atelectasis and/or mild fibrosis. Partially visualized small pleural effusions. Review of the MIP images confirms the above findings CTA HEAD FINDINGS Anterior circulation: The intracranial right ICA is occluded proximally with reconstitution of the supraclinoid segment via the right posterior communicating artery. A right supraclinoid ICA aneurysm projecting posteriorly measures 10 x 8 mm. 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 There is no residual opacification of this aneurysm. The intracranial left ICA is widely patent. The left cavernous ICA is tortuous with a 4 mm superiorly projecting aneurysm noted proximally. The right A1 segment is absent or occluded. The left ACA is widely patent and supplies the right. The MCAs are patent without M1 stenosis. Major M2 branches appear patent proximally, however there is a decreased number of distal left MCA branch vessels corresponding to the area of infarct demonstrated on perfusion imaging. Posterior circulation: The intracranial vertebral arteries are patent to the basilar. There is mild right V4 stenosis. A long segment fenestration is incidentally noted extending from the V3 to proximal V4 segments on the right. The basilar artery is widely patent. There is a fetal origin of the  left PCA. A relatively large right posterior communicating artery is also present. No significant proximal PCA stenosis is seen. There is a variant vessel extending between the distal basilar artery and left A1 segment. No aneurysm. Venous sinuses: As permitted by contrast timing, patent. Anatomic variants: Multiple variants as detailed above. Delayed phase: Not performed. Review of the MIP  images confirms the above findings CT Brain Perfusion Findings: CBF (<30%) Volume: 35mL Perfusion (Tmax>6.0s) volume: 49mL Mismatch Volume: 14mL Infarction Location:Left frontoparietal cortex and white matter beginning at the posterior aspect of the operculum and extending superiorly. IMPRESSION: 1. Acute, small to moderate-sized left frontoparietal MCA territory infarct. 1.4 mismatch ratio. 2. No acute large vessel occlusion. Decreased number of distal left MCA branch vessels corresponding to the acute infarct. 3. 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 4. Reconstitution of the supraclinoid right ICA. 10 x 8 mm right supraclinoid ICA aneurysm. 5. 4 mm left cavernous ICA aneurysm. 6. Widely patent left common and internal carotid arteries. 7. Widely patent vertebral arteries. The study was reviewed in person with Dr. Amada JupiterKirkpatrick on 04/21/2017 at 8:45 a.m. Electronically Signed: By: Sebastian AcheAllen  Grady M.D. On: 04/21/2017 09:34   Ct Angio Neck W Or Wo Contrast  Addendum Date: 04/21/2017   ADDENDUM REPORT: 04/21/2017 09:45 ADDENDUM: Interlobular septal thickening in the lung apices with small pleural effusions. Consider obtaining chest radiographs to evaluate for edema/CHF. Electronically Signed   By: Sebastian AcheAllen  Grady M.D.   On: 04/21/2017 09:45   Result Date: 04/21/2017 CLINICAL DATA:  Right-sided weakness and aphasia. EXAM: CT ANGIOGRAPHY HEAD AND NECK CT PERFUSION BRAIN TECHNIQUE: Multidetector CT imaging of the head and neck was performed using the standard protocol during bolus administration of intravenous contrast. Multiplanar CT image reconstructions and MIPs were obtained to evaluate the vascular anatomy. Carotid stenosis measurements (when applicable) are obtained utilizing NASCET criteria, using the distal internal carotid diameter as the denominator. Multiphase CT imaging of the brain was performed following IV bolus contrast injection. Subsequent  parametric perfusion maps were calculated using RAPID software. CONTRAST:  90mL ISOVUE-370 IOPAMIDOL (ISOVUE-370) INJECTION 76% COMPARISON:  None. FINDINGS: CTA NECK FINDINGS Aortic arch: Normal variant aortic arch branching pattern with common origin of the brachiocephalic and left common carotid arteries and with the left vertebral artery arising from the arch distal to the left subclavian artery. Mild arch atherosclerosis. Widely patent brachiocephalic and subclavian arteries. Right carotid system: The common carotid artery is patent proximally but demonstrates progressively diminishing contrast opacification distally without significant opacification of the carotid bifurcation. The internal carotid artery is occluded throughout the neck. No significant ECA opacification is identified either. Left carotid system: Patent without evidence of stenosis or dissection. Tortuous mid cervical ICA. Vertebral arteries: Patent without evidence of stenosis or dissection. Strongly dominant left vertebral artery. Skeleton: Moderately advanced diffuse cervical disc degeneration. Other neck: No mass or enlarged lymph nodes. Upper chest: Motion artifact in the lung apices with bronchial wall thickening and interlobular septal thickening. Mild subpleural opacity may reflect atelectasis and/or mild fibrosis. Partially visualized small pleural effusions. Review of the MIP images confirms the above findings CTA HEAD FINDINGS Anterior circulation: The intracranial right ICA is occluded proximally with reconstitution of the supraclinoid segment via the right posterior communicating artery. A right supraclinoid ICA aneurysm projecting posteriorly measures 10 x 8 mm. More proximally, expansion of the right carotid canal at the skull base is consistent with the patient's history of large ICA aneurysm treated with  ICA balloon occlusion. There is no residual opacification of this aneurysm. The intracranial left ICA is widely patent. The  left cavernous ICA is tortuous with a 4 mm superiorly projecting aneurysm noted proximally. The right A1 segment is absent or occluded. The left ACA is widely patent and supplies the right. The MCAs are patent without M1 stenosis. Major M2 branches appear patent proximally, however there is a decreased number of distal left MCA branch vessels corresponding to the area of infarct demonstrated on perfusion imaging. Posterior circulation: The intracranial vertebral arteries are patent to the basilar. There is mild right V4 stenosis. A long segment fenestration is incidentally noted extending from the V3 to proximal V4 segments on the right. The basilar artery is widely patent. There is a fetal origin of the left PCA. A relatively large right posterior communicating artery is also present. No significant proximal PCA stenosis is seen. There is a variant vessel extending between the distal basilar artery and left A1 segment. No aneurysm. Venous sinuses: As permitted by contrast timing, patent. Anatomic variants: Multiple variants as detailed above. Delayed phase: Not performed. Review of the MIP images confirms the above findings CT Brain Perfusion Findings: CBF (<30%) Volume: 35mL Perfusion (Tmax>6.0s) volume: 49mL Mismatch Volume: 14mL Infarction Location:Left frontoparietal cortex and white matter beginning at the posterior aspect of the operculum and extending superiorly. IMPRESSION: 1. Acute, small to moderate-sized left frontoparietal MCA territory infarct. 1.4 mismatch ratio. 2. No acute large vessel occlusion. Decreased number of distal left MCA branch vessels corresponding to the acute infarct. 3. 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 4. Reconstitution of the supraclinoid right ICA. 10 x 8 mm right supraclinoid ICA aneurysm. 5. 4 mm left cavernous ICA aneurysm. 6. Widely patent left common and internal carotid arteries. 7. Widely patent vertebral  arteries. The study was reviewed in person with Dr. Amada Jupiter on 04/21/2017 at 8:45 a.m. Electronically Signed: By: Sebastian Ache M.D. On: 04/21/2017 09:34   Mr Brain Wo Contrast  Result Date: 04/22/2017 CLINICAL DATA:  Stroke, right-sided weakness EXAM: MRI HEAD WITHOUT CONTRAST TECHNIQUE: Multiplanar, multiecho pulse sequences of the brain and surrounding structures were obtained without intravenous contrast. COMPARISON:  CTA 04/21/2017 FINDINGS: Brain: Acute infarct left parietal lobe corresponds closely to the CT perfusion abnormality. No acute infarct on the right. Mild atrophy.  Negative for hydrocephalus.  Negative for hemorrhage. Image quality degraded by motion. Septated cystic lesion in the right cavernous chronic sinus measures 24 x 21 mm. This does not show enhancement on the CT Vascular: Right internal carotid artery is occluded with loss of flow void. 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 Otherwise normal flow voids. Skull and upper cervical spine: Negative Sinuses/Orbits: Paranasal sinuses clear.  Bilateral cataract removal Other: None IMPRESSION: Acute infarct left parietal lobe, this corresponds closely to the CT perfusion abnormality. No other acute infarct Mild generalized atrophy. 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 24 x 21 mm cyst in the right cavernous sinus. There appears to be some remodeling of the skull base on CT in this area. Possible arachnoid cyst Electronically Signed   By: Marlan Palau M.D.   On: 04/22/2017 12:45   Ct Cerebral Perfusion W Contrast  Addendum Date: 04/21/2017   ADDENDUM REPORT: 04/21/2017 09:45 ADDENDUM: Interlobular septal thickening in the lung apices with small pleural effusions. Consider obtaining chest radiographs to  evaluate for edema/CHF. Electronically Signed   By: Sebastian Ache M.D.   On: 04/21/2017 09:45   Result Date: 04/21/2017 CLINICAL  DATA:  Right-sided weakness and aphasia. EXAM: CT ANGIOGRAPHY HEAD AND NECK CT PERFUSION BRAIN TECHNIQUE: Multidetector CT imaging of the head and neck was performed using the standard protocol during bolus administration of intravenous contrast. Multiplanar CT image reconstructions and MIPs were obtained to evaluate the vascular anatomy. Carotid stenosis measurements (when applicable) are obtained utilizing NASCET criteria, using the distal internal carotid diameter as the denominator. Multiphase CT imaging of the brain was performed following IV bolus contrast injection. Subsequent parametric perfusion maps were calculated using RAPID software. CONTRAST:  90mL ISOVUE-370 IOPAMIDOL (ISOVUE-370) INJECTION 76% COMPARISON:  None. FINDINGS: CTA NECK FINDINGS Aortic arch: Normal variant aortic arch branching pattern with common origin of the brachiocephalic and left common carotid arteries and with the left vertebral artery arising from the arch distal to the left subclavian artery. Mild arch atherosclerosis. Widely patent brachiocephalic and subclavian arteries. Right carotid system: The common carotid artery is patent proximally but demonstrates progressively diminishing contrast opacification distally without significant opacification of the carotid bifurcation. The internal carotid artery is occluded throughout the neck. No significant ECA opacification is identified either. Left carotid system: Patent without evidence of stenosis or dissection. Tortuous mid cervical ICA. Vertebral arteries: Patent without evidence of stenosis or dissection. Strongly dominant left vertebral artery. Skeleton: Moderately advanced diffuse cervical disc degeneration. Other neck: No mass or enlarged lymph nodes. Upper chest: Motion artifact in the lung apices with bronchial wall thickening and interlobular septal thickening. Mild subpleural opacity may reflect atelectasis and/or mild fibrosis. Partially visualized small pleural  effusions. Review of the MIP images confirms the above findings CTA HEAD FINDINGS Anterior circulation: The intracranial right ICA is occluded proximally with reconstitution of the supraclinoid segment via the right posterior communicating artery. A right supraclinoid ICA aneurysm projecting posteriorly measures 10 x 8 mm. 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 There is no residual opacification of this aneurysm. The intracranial left ICA is widely patent. The left cavernous ICA is tortuous with a 4 mm superiorly projecting aneurysm noted proximally. The right A1 segment is absent or occluded. The left ACA is widely patent and supplies the right. The MCAs are patent without M1 stenosis. Major M2 branches appear patent proximally, however there is a decreased number of distal left MCA branch vessels corresponding to the area of infarct demonstrated on perfusion imaging. Posterior circulation: The intracranial vertebral arteries are patent to the basilar. There is mild right V4 stenosis. A long segment fenestration is incidentally noted extending from the V3 to proximal V4 segments on the right. The basilar artery is widely patent. There is a fetal origin of the left PCA. A relatively large right posterior communicating artery is also present. No significant proximal PCA stenosis is seen. There is a variant vessel extending between the distal basilar artery and left A1 segment. No aneurysm. Venous sinuses: As permitted by contrast timing, patent. Anatomic variants: Multiple variants as detailed above. Delayed phase: Not performed. Review of the MIP images confirms the above findings CT Brain Perfusion Findings: CBF (<30%) Volume: 35mL Perfusion (Tmax>6.0s) volume: 49mL Mismatch Volume: 14mL Infarction Location:Left frontoparietal cortex and white matter beginning at the posterior aspect  of the operculum and extending superiorly. IMPRESSION: 1. Acute, small to moderate-sized left frontoparietal MCA territory infarct. 1.4  mismatch ratio. 2. No acute large vessel occlusion. Decreased number of distal left MCA branch vessels corresponding to the acute infarct. 3. 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 4. Reconstitution of the supraclinoid right ICA. 10 x 8 mm right supraclinoid ICA aneurysm. 5. 4 mm left cavernous ICA aneurysm. 6. Widely patent left common and internal carotid arteries. 7. Widely patent vertebral arteries. The study was reviewed in person with Dr. Amada Jupiter on 04/21/2017 at 8:45 a.m. Electronically Signed: By: Sebastian Ache M.D. On: 04/21/2017 09:34   Dg Chest Port 1 View  Result Date: 04/22/2017 CLINICAL DATA:  Short of breath EXAM: PORTABLE CHEST 1 VIEW COMPARISON:  04/21/2017 FINDINGS: The heart remains severely enlarged. The thorax is rotated. Evaluation of the mediastinum is limited. Prominence of the central vessels is again noted. Peripheral pulmonary vascularity is within normal limits. No pneumothorax. No pleural effusion. Lungs are grossly clear allowing for rotation. IMPRESSION: Stable cardiomegaly without decompensation. Electronically Signed   By: Jolaine Click M.D.   On: 04/22/2017 07:56   Dg Chest Port 1 View  Result Date: 04/21/2017 CLINICAL DATA:  CHF EXAM: PORTABLE CHEST 1 VIEW COMPARISON:  None. FINDINGS: Cardiomegaly. No overt edema. No confluent opacity or effusion. No acute bony abnormality. IMPRESSION: Cardiomegaly.  No acute findings. Electronically Signed   By: Charlett Nose M.D.   On: 04/21/2017 10:37   Ct Head Code Stroke Wo Contrast  Result Date: 04/21/2017 CLINICAL DATA:  Code stroke.  Right-sided weakness.  Aphasia. EXAM: CT HEAD WITHOUT CONTRAST TECHNIQUE: Contiguous axial images were obtained from the base of the skull through the vertex without intravenous contrast. COMPARISON:   None. FINDINGS: Brain: There is no evidence of acute large territory infarct, intracranial hemorrhage, mass, midline shift, or extra-axial fluid collection. Age related cerebral atrophy is noted. Periventricular white matter hypodensities are nonspecific but compatible with chronic small vessel ischemia, mild for age. Vascular: Calcified atherosclerosis at the skull base. 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 Skull: No fracture or focal osseous lesion. Sinuses/Orbits: Visualized paranasal sinuses and mastoid air cells are clear. No acute orbital findings. Other: None. ASPECTS [facility] Stroke Program Early CT Score) - Ganglionic level infarction (caudate, lentiform nuclei, internal capsule, insula, M1-M3 cortex): 7 - Supraganglionic infarction (M4-M6 cortex): 3 Total score (0-10 with 10 being normal): 10 IMPRESSION: 1. No evidence of acute intracranial abnormality. 2. ASPECTS is 10. 3. Mild chronic small vessel ischemic disease. 4. Abnormal appearance of the right cavernous sinus region related to history of large ICA aneurysm status post remote balloon occlusion. These results were communicated to Dr. Amada Jupiter at 8:36 am on 04/21/2017 by text page via the [employer] messaging system. Electronically Signed   By: Sebastian Ache M.D.   On: 04/21/2017 08:40    ECHO Study Conclusions - Left ventricle: The cavity size was normal. Wall thickness was   increased in a pattern of mild LVH. Definity contrast reveals no   apical throbus - there is thicknening and a &quot;spade-shape&quot; of the   LV apex, suggestive of apical variant hypertrophic   cardiomyopathy. Systolic function was normal. The estimated   ejection fraction was in the range of 60% to 65%. Wall motion was   normal; there were no  regional wall motion abnormalities. The  study is not technically sufficient to allow evaluation of LV   diastolic function. - Aortic valve: Sclerosis without stenosis. There was moderate   regurgitation. - Aorta: Ascending aorta dimension: 3.9 cm. - Ascending aorta: The ascending aorta was mildly dilated. - Mitral valve: Calcified annulus. Mildly thickened leaflets .   There was trivial regurgitation. - Left atrium: Severely dilated. - Right atrium: The atrium was mildly dilated. - Tricuspid valve: There was mild regurgitation. - Pulmonary arteries: PA peak pressure: 36 mm Hg (S). - Inferior vena cava: The vessel was dilated. The respirophasic   diameter changes were blunted (< 50%), consistent with elevated   central venous pressure.  Impressions: - Compared to a prior study in 2016, there are few changes. There   is moderate AI. There is evidence for apical hypertrophic   cardiomyopathy. The ascending aorta is dilated to 3.9 cm. The LA   is severely dialted.  IMPRESSION AND PLAN  Courtney Reyes is a 84 y.o. female with PMH of HTN, HLD, AFIB on Eliquis, Hx of hypertrophic cardiomyopathy, and aortic regurgitation who presents with acute onset Right sided weakness and aphasia. Imaging reveals:  Acute, small/moderate-sized left frontoparietal MCA territory infarct.  1.4 mismatch ratio  Suspected Etiology: likely cardioembolic from AFIB, medication noncompliance with Eliquis Resultant Symptoms: Right sided deficits Stroke Risk Factors: atrial fibrillation, hyperlipidemia and hypertension Other Stroke Risk Factors: Advanced age,Family hx stroke, hypertrophic cardiomyopathy, and aortic regurgitation  Outstanding Stroke Work-up Studies:    Work up completed  PLAN  04/22/2017  Frequent Neurochecks  Telemetry Monitoring NPO until passes MBS Start  Lipitor 20 mg daily Start Aspirin 81 mg daily HgbA1C and Lipid Profile PT/OT/SLP Consult Case Management /MSW Ongoing  aggressive stroke risk factor management Patient's family counseled to be compliant with her antithrombotic medications Patient's family counseled on Lifestyle modifications including, Diet, Exercise, and Stress Follow up with [facility] in 6 weeks, if applicable Palliative Care consultation in progress for goals of care  NEUROLOGICAL  ISSUES   Hx of ANEURYSM 10 x 8 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 4 mm left cavernous ICA aneurysm.  DYSPHAGIA: NPO until passes SLP swallow evaluation- MBS recommends NPO Aspiration Precautions in progress  MEDICAL ISSUES   AFIB, CHRONIC: Continue to HOLD [facility] for now Recommendations for Restarting AC:  If family decides to resume Eliquis, recommend waiting 5-7 days and having Pharmacy to dose. Per family she can afford the medication. Was not taking due to bruising.  HYPERTENSION: Stable, Avoid Hypotension Permissive hypertension (OK if <220/120) for 24-48 hours post stroke and then gradually normalized within 5-7 days. Long term BP goal normotensive. May slowly restart home B/P medications after 48 hours Home Meds: yes  HYPERLIPIDEMIA:    Component Value Date/Time   CHOL 158 04/21/2017 0929   TRIG 46 04/21/2017 0929   HDL 51 04/21/2017 0929   CHOLHDL 3.1 04/21/2017 0929   VLDL 9 04/21/2017 0929   LDLCALC 98 04/21/2017 0929  Home Meds:  NONE Start Lipitor 20 mg daily LDL  goal < 70 Continue statin at discharge  DIABETES: Lab Results  Component Value Date   HGBA1C 6.0 (H) 04/21/2017  HgbA1c goal < 7.0 Continue CBG monitoring and SSI to maintain glucose 140-180 mg/dl DM education, if necessary    Hospital day # 1 VTE prophylaxis: SCD's Diet - Diet NPO time specified Fall precautions Aspiration precautions     FAMILY UPDATES:  family at bedside  TEAM UPDATES:Vann, Selinda Orion, DO  STATUS:  DNR, Palliative Care consulted  Discharge Information  Prior Home Stroke Medications:  Eliquis (apixaban) daily  Discharge Stroke Meds:  Please discharge patient on TBD, if family decides to resume Eliquis, recommend waiting 5-7 days and having Pharmacy to dose. Per family she can afford the medication. Was not taking due to bruising.    Disposition: Final discharge disposition not confirmed Therapy Recs:               PENDING  Follow up Appointments  Follow Up:  Renford Dills, MD -PCP Follow up in 1-2 weeks      Assessment and plan discussed with with attending physician and they are in agreement.    Beryl Meager, ANP-C [facility] 04/22/2017, 1:52 PM  04/22/2017 ATTENDING ASSESSMENT  I reviewed above note and agree with the assessment and plan. I have made any additions or clarifications directly to the above note. Pt was seen and examined.   84 year old female with history of hypertension, hyperlipidemia, atrial fibrillation on Eliquis, CHF admitted for right-sided weakness and aphasia.  CT no acute abnormality.  CTA showed right ICA chronic occlusion and right ICA supraclinoid aneurysm 10 x 8 mm as well as left cavernous ICA 4 mm aneurysm.  CT perfusion showed left MCA cord infarct without significant penumbra.  MRI brain showed moderate to large left MCA infarct.  EF 60-65% with LA severely dilated.  LDL 98 and A1c 6.0.  As per chart patient taking Eliquis 2.5 mg once a day at home which was under dosed for A. fib stroke prevention.  Currently, patient advanced age, global aphasia, right hemiplegia, and dysphagia.  She would have limited quality of life and poor prognosis.  I discussed with family at bedside, they all agree with palliative care consultation and to discuss with goals of care.  Continue aspirin PR at this time.  If family decides continued medical care, may consider Eliquis in 5-7 days due to large size of the stroke.  Eliquis dose should be calculated by pharmacy given borderline weight and kidney function.  Neurology will sign off. Please  call with questions. Thanks for the consult.  Marvel Plan, MD PhD Stroke Neurology 04/22/2017 4:18 PM   Please page stroke NP/ PA / MD from 8am -4 pm   You can look them up on [email or web address]  Password [email or web address]

## 2017-04-22 NOTE — Consult Note (Signed)
Modified Barium Swallow Progress Note  Patient Details  Name: Courtney Reyes MRN: [id number] Date of Birth: 04/30/1924  Today's Date: 04/22/2017  Modified Barium Swallow completed.  Full report located under Chart Review in the Imaging Section.  Brief recommendations include the following:  Clinical Impression Extremely limited study, due to pt lack of cooperation and severity of oral deficits. Multiple attempts to provide puree and nectar thick liquid consistencies were made, with pt refusing most presentations by turning her head from side to side and clamping her lips closed. In an effort to encourage participation with more functional presentations, a very small amount of nectar thick liquid was placed in left lateral sulcus via syringe,  however, the entire bolus was observed to spill anteriorly and no swallow reflex was elicited.   The provision of puree and nectar thick liquids was attempted several times without success, so the study was terminated.   Given severity of oral issues and overt indication of airway compromise documented on BSE, NPO status continues to be recommended at this time. ST will continue to follow to assess appropriateness for repeat study, or initiation of po diet.   Swallow Evaluation Recommendations  SLP Diet Recommendations: Alternative means - temporary;NPO   Medication Administration: Via alternative means   Oral Care Recommendations: Oral care QID   Other Recommendations: Have oral suction available;Remove water pitcher  Courtney Reyes, [facility] HospitalMSP, CCC-SLP Speech Language Pathologist [phone]  Courtney Reyes, Courtney Reyes 04/22/2017,2:17 PM

## 2017-04-22 NOTE — Progress Notes (Signed)
Physical Therapy Evaluation  Assessment: At the time of PT eval pt required +2 total assist for transfers to/from EOB. Family reports that PTA, pt was independent with ADL's at home and occasionally used a RW for support. At this time family is adamant about taking pt home and caring for her themselves at d/c. I do feel that SNF would be the most appropriate d/c disposition in regards to safety and participating in rehab. If family's goals are comfort at this time and continue to refuse SNF, then pt will require home health therapies for continued education for safe mobility. Acutely, pt will benefit from skilled PT to increase their independence and safety with mobility to allow discharge to the venue listed below.    04/22/17 1434  PT Visit Information  Last PT Received On 04/22/17  Assistance Needed +2  History of Present Illness Pt is a 84 y/o female with a PMH significant for PAF, OA, HTN, cerebral aneurysm, cataracts, cardiomyopathy, aortic regurgitation. She presents s/p L MCA stroke and PTA pt was independent with occasional use of RW at home.   Precautions  Precautions Fall  Precaution Comments L inattention; R hemiparesis  Restrictions  Weight Bearing Restrictions No  Home Living  Family/patient expects to be discharged to: Private residence  Living Arrangements Alone  Available Help at Discharge Family;Available PRN/intermittently  Type of Home House  Home Access Stairs to enter  Entrance Stairs-Number of Steps 3  Entrance Stairs-Rails Right;Left (Back, 1 on R and wall; front, 2 rails far apart)  Home Layout Two level;Able to live on main level with bedroom/bathroom  Bathroom Shower/Tub Tub/shower unit  [employer] IndustriesBathroom Toilet Handicapped height  Home Equipment Tub bench;Walker - 4 wheels;Grab bars - tub/shower;BSC  Prior Function  Level of Independence Independent with assistive device(s)  Comments Not driving, cooked all of her own meals, independent with bathing/dressing, cleans  her own home  Communication  Communication Expressive difficulties  Pain Assessment  Pain Assessment Faces  Faces Pain Scale 0  Cognition  Arousal/Alertness Awake/alert  Behavior During Therapy Impulsive  Overall Cognitive Status Impaired/Different from baseline  Area of Impairment Orientation;Attention;Memory;Following commands;Safety/judgement;Awareness;Problem solving  Orientation Level (Unable to answer orientation questions)  Current Attention Level Focused  Memory Decreased short-term memory  Following Commands Follows one step commands inconsistently;Follows one step commands with increased time  Safety/Judgement Decreased awareness of safety;Decreased awareness of deficits  Awareness Intellectual  Problem Solving Slow processing;Decreased initiation;Difficulty sequencing;Requires verbal cues;Requires tactile cues  General Comments Pt with some attempts at speech but was mostly unintelligible   Upper Extremity Assessment  Upper Extremity Assessment RUE deficits/detail  RUE Deficits / Details Flaccid; ~0.5cm subluxation in R shoulder (not formally measured)  RUE Sensation (Unable to assess)  RUE Coordination decreased fine motor;decreased gross motor  Lower Extremity Assessment  Lower Extremity Assessment RLE deficits/detail;Generalized weakness  RLE Deficits / Details No active movement noted. Not able to formally assess.   RLE Sensation (Unable to assess)  RLE Coordination decreased fine motor;decreased gross motor  Cervical / Trunk Assessment  Cervical / Trunk Assessment Kyphotic  Bed Mobility  Overal bed mobility Needs Assistance  Bed Mobility Supine to Sit;Sit to Supine  Supine to sit Total assist;+2 for physical assistance;HOB elevated  Sit to supine Total assist;+2 for physical assistance  General bed mobility comments Pt required assist for all aspects of transfer to/from EOB. Bed pad used to assist with scooting hips and posterior support provided throughout  transfers at the trunk.   Transfers  General transfer comment Not  tested at this time.   Balance  Overall balance assessment Needs assistance  Sitting-balance support Feet supported;Bilateral upper extremity supported  Sitting balance-Leahy Scale Zero  Sitting balance - Comments Max assist (+1) or +2 required for sitting balance at EOB. Noted R lateral lean, which was improved with left lateral lean onto elbow.   Postural control Right lateral lean  PT - End of Session  Activity Tolerance Patient tolerated treatment well  Patient left in bed;with call bell/phone within reach;with bed alarm set;with family/visitor present  Nurse Communication Mobility status;Need for lift equipment  PT Assessment  PT Recommendation/Assessment Patient needs continued PT services  PT Visit Diagnosis Hemiplegia and hemiparesis;Other symptoms and signs involving the nervous system (R29.898)  Hemiplegia - Right/Left Right  Hemiplegia - dominant/non-dominant Dominant  Hemiplegia - caused by Cerebral infarction  PT Problem List Decreased strength;Decreased range of motion;Decreased activity tolerance;Decreased balance;Decreased mobility;Decreased coordination;Decreased cognition;Decreased knowledge of use of DME;Decreased safety awareness;Decreased knowledge of precautions  PT Plan  PT Frequency (ACUTE ONLY) Min 4X/week  PT Treatment/Interventions (ACUTE ONLY) DME instruction;Gait training;Stair training;Functional mobility training;Therapeutic activities;Therapeutic exercise;Neuromuscular re-education;Patient/family education  AM-PAC PT "6 Clicks" Daily Activity Outcome Measure  Difficulty turning over in bed (including adjusting bedclothes, sheets and blankets)? 1  Difficulty moving from lying on back to sitting on the side of the bed?  1  Difficulty sitting down on and standing up from a chair with arms (e.g., wheelchair, bedside commode, etc,.)? 1  Help needed moving to and from a bed to chair (including a  wheelchair)? 1  Help needed walking in hospital room? 1  Help needed climbing 3-5 steps with a railing?  1  6 Click Score 6  Mobility G Code  CN  PT Recommendation  Follow Up Recommendations SNF;Supervision/Assistance - 24 hour (Family refusing)  PT equipment Wheelchair (measurements PT);Wheelchair cushion (measurements PT);Hospital bed  Individuals Consulted  Consulted and Agree with Results and Recommendations Family member/caregiver  Family Member Consulted Daughters, granddaughter present  Acute Rehab PT Goals  Patient Stated Goal Per daughter, for pt to be comfortable  PT Goal Formulation With patient  Time For Goal Achievement 05/06/17  Potential to Achieve Goals Good  PT Time Calculation  PT Start Time (ACUTE ONLY) 1358  PT Stop Time (ACUTE ONLY) 1425  PT Time Calculation (min) (ACUTE ONLY) 27 min  PT General Charges  $$ ACUTE PT VISIT 1 Visit  PT Evaluation  $PT Eval Moderate Complexity 1 Mod  Written Expression  Dominant Hand Right    Conni Slipper, PT, DPT Acute Rehabilitation Services Pager: [phone]

## 2017-04-22 NOTE — Evaluation (Signed)
Occupational Therapy Evaluation Patient Details Name: Courtney Reyes MRN: [id number] DOB: 1924/05/12 Today's Date: 04/22/2017    History of Present Illness Patient brought in by EMS as a code stroke.  Patient at 7:00 in the morning.  EMS noted right-sided weakness and trouble speaking   Clinical Impression   Pt with decline in function and safety with ADLs and ADL mobility with decreased strength, balance, endurance, coordination, cognition and R UE function. Pt requires total A for ADLs and total A +2 for bed mobility. Pt's family adamant about pt returning home after acute d/c and report that they just want he rto be comfortable and to be trained on how to take care of her. Family also states that two other family member are nurses/CNA and that they can provide current level of care. Recommend short term SNF for safest d/c venue, however family persists in going home then [facility]. Pt would benefit from acute OT services to address impairments to maximize level of function and safety    Follow Up Recommendations  SNF;Supervision/Assistance - 24 hour (family refusing SNF)   Equipment Recommendations  Other (comment)(TBD)    Recommendations for Other Services       Precautions / Restrictions Precautions Precautions: Other (comment) Precaution Comments: decreased tone and ROM R UE Restrictions Weight Bearing Restrictions: No      Mobility Bed Mobility Overal bed mobility: Needs Assistance Bed Mobility: Supine to Sit;Sit to Supine     Supine to sit: Total assist;+2 for physical assistance;+2 for safety/equipment Sit to supine: Total assist;+2 for physical assistance;+2 for safety/equipment   General bed mobility comments: pt able to move L LE towards EOB minimally  Transfers                 General transfer comment: NT    Balance Overall balance assessment: Needs assistance Sitting-balance support: Bilateral upper extremity supported;Feet supported Sitting balance-Leahy  Scale: Poor                                     ADL either performed or assessed with clinical judgement   ADL Overall ADL's : Needs assistance/impaired Eating/Feeding: Total assistance   Grooming: Total assistance   Upper Body Bathing: Total assistance   Lower Body Bathing: Total assistance   Upper Body Dressing : Total assistance   Lower Body Dressing: Total assistance       Toileting- Clothing Manipulation and Hygiene: Total assistance;Bed level       Functional mobility during ADLs: Total assistance;+2 for physical assistance;+2 for safety/equipment General ADL Comments: pt with Poor sitting balance EOB and R side leaning     Vision Baseline Vision/History: (unable to properly assess at this time) Vision Assessment?: Vision impaired- to be further tested in functional context     Perception     Praxis      Pertinent Vitals/Pain Pain Assessment: No/denies pain Pain Score: 0-No pain Faces Pain Scale: No hurt Pain Intervention(s): Monitored during session     Hand Dominance Right   Extremity/Trunk Assessment Upper Extremity Assessment Upper Extremity Assessment: Generalized weakness;RUE deficits/detail RUE Deficits / Details: decreased tone/flaccid, AROM impaired RUE Sensation: (P) (Unable to assess) RUE Coordination: (P) decreased fine motor;decreased gross motor   Lower Extremity Assessment Lower Extremity Assessment: Defer to PT evaluation RLE Deficits / Details: (P) No active movement noted. Not able to formally assess.  RLE Sensation: (P) (Unable to assess) RLE Coordination: (P) decreased  fine motor;decreased gross motor   Cervical / Trunk Assessment Cervical / Trunk Assessment: Kyphotic   Communication Communication Communication: Expressive difficulties   Cognition Arousal/Alertness: Awake/alert Behavior During Therapy: Anxious;Flat affect Overall Cognitive Status: Impaired/Different from baseline Area of Impairment:  Orientation;Attention;Following commands;Awareness                 Orientation Level: (P) (Unable to answer orientation questions) Current Attention Level: (P) Focused Memory: (P) Decreased short-term memory Following Commands: (P) Follows one step commands inconsistently;Follows one step commands with increased time Safety/Judgement: (P) Decreased awareness of safety;Decreased awareness of deficits Awareness: (P) Intellectual Problem Solving: (P) Slow processing;Decreased initiation;Difficulty sequencing;Requires verbal cues;Requires tactile cues General Comments: unable to properly assess cognition, pt non verbal but laughing   General Comments       Exercises     Shoulder Instructions      Home Living Family/patient expects to be discharged to:: Private residence Living Arrangements: Alone Available Help at Discharge: Family;Available PRN/intermittently Type of Home: House Home Access: Stairs to enter [employer] CorporationEntrance Stairs-Number of Steps: 3 Entrance Stairs-Rails: Right;Left Home Layout: Two level;Able to live on main level with bedroom/bathroom     Bathroom Shower/Tub: Tub/shower unit   Bathroom Toilet: Handicapped height     Home Equipment: Tub bench;Walker - 4 wheels;Grab bars - tub/shower;Bedside commode          Prior Functioning/Environment Level of Independence: Independent with assistive device(s)        Comments: Not driving, cooked all of her own meals, independent with bathing/dressing, cleans her own home        OT Problem List: Decreased strength;Decreased activity tolerance;Decreased knowledge of use of DME or AE;Impaired tone;Impaired UE functional use;Increased edema;Decreased range of motion;Impaired balance (sitting and/or standing);Decreased coordination;Decreased safety awareness;Decreased knowledge of precautions;Impaired sensation      OT Treatment/Interventions: Self-care/ADL training;DME and/or AE instruction;Therapeutic  activities;Therapeutic exercise;Balance training;Neuromuscular education;Patient/family education    OT Goals(Current goals can be found in the care plan section) Acute Rehab OT Goals Patient Stated Goal: for pt to be comfortable per her daughter OT Goal Formulation: With patient/family Time For Goal Achievement: 05/06/17 Potential to Achieve Goals: Good ADL Goals Pt Will Perform Grooming: with max assist;sitting;with caregiver independent in assisting Pt Will Perform Upper Body Bathing: with max assist;sitting;with caregiver independent in assisting Pt Will Perform Upper Body Dressing: with max assist;with caregiver independent in assisting Pt Will Transfer to Toilet: with max assist;stand pivot transfer;anterior/posterior transfer;bedside commode Additional ADL Goal #1: Pt will tolerate R UE P/AAROM in multile planes Additional ADL Goal #2: Pt will complete bed mobility with max A to sit EOB in prep for grooming and transfers  OT Frequency: Min 2X/week   Barriers to D/C: Decreased caregiver support  pt lives at home alone, family adamant for pt to return home and not short term SNF for therapy       Co-evaluation PT/OT/SLP Co-Evaluation/Treatment: Yes Reason for Co-Treatment: For patient/therapist safety;To address functional/ADL transfers   OT goals addressed during session: Strengthening/ROM;ADL's and self-care      AM-PAC PT "6 Clicks" Daily Activity     Outcome Measure Help from another person eating meals?: Total Help from another person taking care of personal grooming?: Total Help from another person toileting, which includes using toliet, bedpan, or urinal?: Total Help from another person bathing (including washing, rinsing, drying)?: Total Help from another person to put on and taking off regular upper body clothing?: Total Help from another person to put on and taking off regular lower body  clothing?: Total 6 Click Score: 6   End of Session    Activity Tolerance:  Patient limited by fatigue Patient left: in bed;with call bell/phone within reach;with bed alarm set;with family/visitor present  OT Visit Diagnosis: Other abnormalities of gait and mobility (R26.89);Muscle weakness (generalized) (M62.81);Hemiplegia and hemiparesis;Other symptoms and signs involving cognitive function Hemiplegia - Right/Left: Right Hemiplegia - dominant/non-dominant: Dominant                Time: 0200-0224 OT Time Calculation (min): 24 min Charges:  OT General Charges $OT Visit: 1 Visit OT Evaluation $OT Eval Moderate Complexity: 1 Mod G-Codes: OT G-codes **NOT FOR INPATIENT CLASS** Functional Assessment Tool Used: AM-PAC 6 Clicks Daily Activity     Galen Manila 04/22/2017, 2:50 PM

## 2017-04-23 MED ORDER — LORAZEPAM 0.5 MG PO TABS
0.2500 mg | ORAL_TABLET | Freq: Once | ORAL | Status: AC
  Administered 2017-04-24: 0.25 mg via ORAL
  Filled 2017-04-23: qty 1

## 2017-04-23 NOTE — Progress Notes (Signed)
PROGRESS NOTE    Courtney Reyes  ZOX:[id number] DOB: 1924-03-06 DOA: 04/21/2017 PCP: Renford Dills, MD   Outpatient Specialists:     Brief Narrative:  Courtney Reyes is a 84 y.o. female with medical history significant for hypertension, hyperlipidemia, atrial fibrillation on Eliquis, history of hypertrophic cardiomyopathy and aortic regurgitation who presented to our emergency department this morning with right-sided weakness and dysarthria.  Found to have new CVA.  Has not passed her swallowing eval.  NPO until status clarified-- comfort etc.    Assessment & Plan:   Principal Problem:   Acute ischemic left MCA stroke (HCC) Active Problems:   Aortic regurgitation   Apical variant hypertrophic cardiomyopathy (HCC)   PAF (paroxysmal atrial fibrillation) (HCC)   Chronic anticoagulation   Essential hypertension   Hyperlipidemia   Cataracts, bilateral     Acute ischemic left MCA stroke:  -echocardiogram : - Compared to a prior study in 2016, there are few changes. There   is moderate AI. There is evidence for apical hypertrophic   cardiomyopathy. The ascending aorta is dilated to 3.9 cm. The LA   is severely dialted. -SLP eval failed -once passes swallow eval will need resumption of eliquis -neuro consult appreciated -poor overall prognosis-- discussed with neuro-- palliative care consulted-- family in room adamant that she would want to be at home -if spike temp, may need coverage for aspiration  Paroxysmal atrial fibrillation:  -resume eliquis when able  H/o Aortic regurgitation:  -echo pending  Essential hypertension:  -permissive HTN  Hyperlipidemia:  -statin  Bilateral cataracts:  -Continue Xalatan and Cosopt drops      DVT prophylaxis:   SCD's  Code Status: DNR   Family Communication: Family in room  Disposition Plan:     Consultants:  Neuro Palliative care  Subjective: More awake and interactive  Objective: Vitals:   04/23/17  0000 04/23/17 0400 04/23/17 0900 04/23/17 1217  BP: 119/76 110/61 140/89   Pulse: (!) 52 (!) 59    Resp: 18 18    Temp: 98.6 F (37 C) 98.2 F (36.8 C) 98.4 F (36.9 C) 97.6 F (36.4 C)  TempSrc: Axillary Axillary Axillary Axillary  SpO2: 98% 97%    Weight:      Height:       No intake or output data in the 24 hours ending 04/23/17 1420 Filed Weights   04/21/17 0857  Weight: 64 kg (141 lb 1.5 oz)    Examination:  General exam: making eye contact Respiratory system: no wheezing Cardiovascular system: rrr Gastrointestinal system: +Bs, soft Central nervous system: moving right leg     Data Reviewed: I have personally reviewed following labs and imaging studies  CBC: Recent Labs  Lab 04/21/17 0813 04/21/17 0822  WBC 4.3  --   NEUTROABS 1.7  --   HGB 11.8* 12.2  HCT 36.2 36.0  MCV 96.3  --   PLT 208  --    Basic Metabolic Panel: Recent Labs  Lab 04/21/17 0813 04/21/17 0822  NA 139 142  K 3.7 3.6  CL 106 106  CO2 23  --   GLUCOSE 130* 127*  BUN 15 17  CREATININE 0.97 0.90  CALCIUM 8.9  --    GFR: Estimated Creatinine Clearance: 35.9 mL/min (by C-G formula based on SCr of 0.9 mg/dL). Liver Function Tests: Recent Labs  Lab 04/21/17 0813  AST 25  ALT 10*  ALKPHOS 70  BILITOT 0.6  PROT 6.7  ALBUMIN 2.9*   No results for input(s):  LIPASE, AMYLASE in the last 168 hours. No results for input(s): AMMONIA in the last 168 hours. Coagulation Profile: Recent Labs  Lab 04/21/17 0813  INR 1.18   Cardiac Enzymes: No results for input(s): CKTOTAL, CKMB, CKMBINDEX, TROPONINI in the last 168 hours. BNP (last 3 results) No results for input(s): PROBNP in the last 8760 hours. HbA1C: Recent Labs    04/21/17 0929  HGBA1C 6.0*   CBG: Recent Labs  Lab 04/21/17 0810  GLUCAP 115*   Lipid Profile: Recent Labs    04/21/17 0929  CHOL 158  HDL 51  LDLCALC 98  TRIG 46  CHOLHDL 3.1   Thyroid Function Tests: No results for input(s): TSH, T4TOTAL,  FREET4, T3FREE, THYROIDAB in the last 72 hours. Anemia Panel: No results for input(s): VITAMINB12, FOLATE, FERRITIN, TIBC, IRON, RETICCTPCT in the last 72 hours. Urine analysis: No results found for: COLORURINE, APPEARANCEUR, LABSPEC, PHURINE, GLUCOSEU, HGBUR, BILIRUBINUR, KETONESUR, PROTEINUR, UROBILINOGEN, NITRITE, LEUKOCYTESUR  )No results found for this or any previous visit (from the past 240 hour(s)).    Anti-infectives (From admission, onward)   None       Radiology Studies: Mr Brain Wo Contrast  Result Date: 04/22/2017 CLINICAL DATA:  Stroke, right-sided weakness EXAM: MRI HEAD WITHOUT CONTRAST TECHNIQUE: Multiplanar, multiecho pulse sequences of the brain and surrounding structures were obtained without intravenous contrast. COMPARISON:  CTA 04/21/2017 FINDINGS: Brain: Acute infarct left parietal lobe corresponds closely to the CT perfusion abnormality. No acute infarct on the right. Mild atrophy.  Negative for hydrocephalus.  Negative for hemorrhage. Image quality degraded by motion. Septated cystic lesion in the right cavernous chronic sinus measures 24 x 21 mm. This does not show enhancement on the CT Vascular: Right internal carotid artery is occluded with loss of flow void. 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 Otherwise normal flow voids. Skull and upper cervical spine: Negative Sinuses/Orbits: Paranasal sinuses clear.  Bilateral cataract removal Other: None IMPRESSION: Acute infarct left parietal lobe, this corresponds closely to the CT perfusion abnormality. No other acute infarct Mild generalized atrophy. 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 24 x 21 mm cyst in the right cavernous sinus. There appears to be some remodeling of the skull base on CT in this area. Possible arachnoid cyst Electronically Signed   By: Marlan Palau M.D.   On: 04/22/2017 12:45   Dg Chest Port 1  View  Result Date: 04/22/2017 CLINICAL DATA:  Short of breath EXAM: PORTABLE CHEST 1 VIEW COMPARISON:  04/21/2017 FINDINGS: The heart remains severely enlarged. The thorax is rotated. Evaluation of the mediastinum is limited. Prominence of the central vessels is again noted. Peripheral pulmonary vascularity is within normal limits. No pneumothorax. No pleural effusion. Lungs are grossly clear allowing for rotation. IMPRESSION: Stable cardiomegaly without decompensation. Electronically Signed   By: Jolaine Click M.D.   On: 04/22/2017 07:56   Dg Swallowing Func-speech Pathology  Result Date: 04/22/2017 Objective Swallowing Evaluation: Type of Study: MBS-Modified Barium Swallow Study  Patient Details Name: Courtney Reyes MRN: [id number] Date of Birth: June 05, 1924 Today's Date: 04/22/2017 Time: SLP Start Time (ACUTE ONLY): 1110 -SLP Stop Time (ACUTE ONLY): 1130 SLP Time Calculation (min) (ACUTE ONLY): 20 min Past Medical History: Past Medical History: Diagnosis Date . Aortic regurgitation 03/16/2015 . Apical variant hypertrophic cardiomyopathy (HCC) 03/16/2015 . Cataracts, bilateral  . Cerebral aneurysm  . Hypercholesteremia  . Hypertension  . Osteoarthritis  . PAF (paroxysmal  atrial fibrillation) (HCC) 03/23/2015 Past Surgical History: No past surgical history on file. HPI: Sharlyn I Holleyis a 84 y.o.femalewith medical history significantfor hypertension, hyperlipidemia, atrial fibrillation on Eliquis, history of hypertrophic cardiomyopathy and aortic regurgitation who presented to our emergency department this morning with right-sided weakness and dysarthria. Stroke head CT shows no acute findings CTA of the head showed no large vessel occlusion but an acute moderate sized left frontoparietal MCA territory infarction.  Subjective: Pt seen in radiology for MBS to objectively assess swallow function and safety, and identify least restrictive diet. Pt awake, left gaze preference. No family present Assessment / Plan /  Recommendation CHL IP CLINICAL IMPRESSIONS 04/22/2017 Clinical Impression Extremely limited study, due to pt lack of cooperation and severity of oral deficits. Multiple attempts to provide puree and nectar thick liquid consistencies were made, with pt refusing most presentations by turning her head from side to side and clamping her lips closed. In an effort to encourage participation with more functional presentations, a very small amount of nectar thick liquid was placed in left lateral sulcus via syringe,  however, the entire bolus was observed to spill anteriorly and no swallow reflex was elicited. The provision of puree and nectar thick liquids was attempted several times without success, so the study was terminated. Given severity of oral issues and overt indication of airway compromise documented on BSE, NPO status continues to be recommended at this time. ST will continue to follow to assess appropriateness for repeat study, or initiation of po diet.  SLP Visit Diagnosis Dysphagia, unspecified (R13.10) Impact on safety and function Severe aspiration risk;Risk for inadequate nutrition/hydration   CHL IP TREATMENT RECOMMENDATION 04/22/2017 Treatment Recommendations Therapy as outlined in treatment plan below   Prognosis 04/22/2017 Prognosis for Safe Diet Advancement Fair Barriers to Reach Goals Language deficits;Severity of deficits;Behavior CHL IP DIET RECOMMENDATION 04/22/2017 SLP Diet Recommendations Alternative means - temporary;NPO Medication Administration Via alternative means   CHL IP OTHER RECOMMENDATIONS 04/22/2017 Oral Care Recommendations Oral care QID Other Recommendations Have oral suction available;Remove water pitcher   CHL IP FOLLOW UP RECOMMENDATIONS 04/22/2017 Follow up Recommendations 24 hour supervision/assistance;Skilled Nursing facility   [facility] F[facility]QUENC[facility] D[facility] 04/22/2017 Speech Therapy Frequency (ACUTE ONLY) min 2x/week Treatment Duration 2 weeks      CHL IP ORAL PHASE 04/22/2017 Oral  Phase Impaired Oral - Nectar Teaspoon Right anterior bolus loss;Decreased bolus cohesion;Reduced posterior propulsion;Holding of bolus Oral - Puree Right anterior bolus loss;Reduced posterior propulsion;Holding of bolus;Decreased bolus cohesion  CHL IP PHARYNGEAL PHASE 04/22/2017 Pharyngeal Phase No swallow reflex was elicited. Minimal bolus placed in oral cavity spilled out anteriorly and was not swallowed.  CHL IP CERVICAL ESOPHAGEAL PHASE 04/22/2017 Cervical Esophageal Phase No swallow reflex elicited Celia B. Murvin NatalBueche, [facility] RegionMSP, CCC-SLP Speech Language Pathologist [phone] Leigh AuroraBueche, Celia Brown 04/22/2017, 2:12 PM                   Scheduled Meds: . aspirin EC  325 mg Oral Daily   Or  . aspirin  300 mg Rectal Daily  . dorzolamide-timolol  1 drop Both Eyes BID  . latanoprost  1 drop Both Eyes QHS   Continuous Infusions: . sodium chloride 75 mL/hr at 04/22/17 0417     LOS: 2 days    Time spent: 25 min    Joseph ArtJessica U Anella Nakata, DO [facility] Pager [phone]  If 7PM-7AM, please contact night-coverage [email or web address] Password [email or web address] 04/23/2017, 2:20 PM

## 2017-04-23 NOTE — Progress Notes (Signed)
CSW acknowledging recommendation for SNF at this time, but the family is refusing SNF placement. Per treatment team, family wants to take the patient home. CSW not needed for discharge plans at this time.  CSW signing off.  Blenda NicelyElizabeth Amai Cappiello, KentuckyLCSW Clinical Social Worker [phone]

## 2017-04-23 NOTE — Progress Notes (Signed)
Physical Therapy Treatment Patient Details Name: Courtney HeritageMable I Guisinger MRN: [id number] DOB: 08/28/1924 Today's Date: 04/23/2017    History of Present Illness Patient brought in by EMS as a code stroke.  Patient at 7:00 in the morning.  EMS noted right-sided weakness and trouble speaking.  MRI revealed acute left parietal lobe infarct, occluded R internal carotid artery with aneurysm, and incidental finding of R cavernous sinus cyst (possible arachnoid).  Pt with significant PMH of PAF, HTN, cerebral aneurysm, hypertrophic cardiomyopathy, and aortic regurgitation.     PT Comments    Pt did better today participating more in her own mobility to EOB.  She does have some weak muscle activation in her right leg and followed 75% of basic commands given extra time and structure as well as multi modal cues.  We attempted to stand, but it did not go well with only one person assist.  I feel she would do well attempting to stand in some thing like the steady stander or just starting with a squat pivot or lift OOB to chair. Daughter and pt would like to attempt OOB (however safest) next session.   Daughter and pt are interested in follow up therapy post discharge.    Follow Up Recommendations  Home health PT;Supervision/Assistance - 24 hour     Equipment Recommendations  Wheelchair (measurements PT);Wheelchair cushion (measurements PT);Hospital bed;Other (comment)(hoyer lift, ramp)    Recommendations for Other Services   NA     Precautions / Restrictions Precautions Precautions: Fall Precaution Comments: decreased tone and ROM R UE    Mobility  Bed Mobility Overal bed mobility: Needs Assistance Bed Mobility: Supine to Sit;Sit to Supine     Supine to sit: HOB elevated;Mod assist Sit to supine: Mod assist   General bed mobility comments:  Heavy mod assist to help support trunk and LEs to EOB.  Pt attempting to help as bes she could and then therapist having her reach for the foot board on the bed to  help hold herself up once transitioned to sitting.  Assist needed separately at trunk and then at legs to return to supine.  Pt again, attempting to help lift her legs and control her trunk with her left arm.  She was actually able to help pull and push herself up to the [facility] as well with therapist's assist and bed in full trendelenberg.    Transfers Overall transfer level: Needs assistance Equipment used: None Transfers: Sit to/from Stand Sit to Stand: Max assist;From elevated surface         General transfer comment: Attepted to stand at EOB, but only managed partial stand with both legs blocked.  Pt, when she released her left hand from bed rail leaned significantly more to the right making the stand very difficult with only one person assist.  She would probably do well with the steady standing fram as she does have some muscle activation in her right leg.   Ambulation/Gait             General Gait Details: unable at this time.        Modified Rankin (Stroke Patients Only) Modified Rankin (Stroke Patients Only) Pre-Morbid Rankin Score: Slight disability Modified Rankin: Severe disability     Balance Overall balance assessment: Needs assistance Sitting-balance support: Feet supported;Single extremity supported Sitting balance-Leahy Scale: Poor Sitting balance - Comments: Meeds up to mod assist EOB for midline sitting balance.  She did well coming out of the right side of the bed and being able  to reach and maintain grasp on the foot board for ~15 mins.  As she fatigued, even with left hand on the end of the bed she began to lean more significantly to the right.  She responded to verbal cues and manual facilitation to right herself.  she was able to preform seated exercises EOB as well.                                     Cognition Arousal/Alertness: Awake/alert Behavior During Therapy: WFL for tasks assessed/performed Overall Cognitive Status:  Impaired/Different from baseline Area of Impairment: Attention;Following commands;Safety/judgement;Awareness;Problem solving                   Current Attention Level: Sustained   Following Commands: Follows one step commands inconsistently;Follows one step commands with increased time Safety/Judgement: Decreased awareness of safety;Decreased awareness of deficits Awareness: Intellectual Problem Solving: Slow processing;Decreased initiation;Difficulty sequencing;Requires verbal cues;Requires tactile cues General Comments: Pt was able to follow simple commands with multimodal cues, at times she would start a motion to command, but be unable to stop, and at times she would follow a command accurately, but then when asked to do something else had a hard time transitioning without visual, manual, and basic verbal cues.       Exercises General Exercises - Upper Extremity Shoulder Flexion: AAROM;Supine(educated pt/daughter on having pt move her R arm at wrist. ) General Exercises - Lower Extremity Ankle Circles/Pumps: AROM;Left;Right;Seated Long Arc Quad: AROM;Both;20 reps;Seated Hip Flexion/Marching: AROM;Both;10 reps;Seated    General Comments General comments (skin integrity, edema, etc.): Spoke at length with daughter re: needed equipment at home.  Provided her with ramp rental company information and a list of equipment I would recommend and/or she is interested in inquiring about.        Pertinent Vitals/Pain Pain Assessment: No/denies pain Pain Score: 0-No pain           PT Goals (current goals can now be found in the care plan section) Acute Rehab PT Goals Patient Stated Goal: to take pt home and recover at home with family.  Progress towards PT goals: Progressing toward goals    Frequency    Min 4X/week      PT Plan Current plan remains appropriate       AM-PAC PT "6 Clicks" Daily Activity  Outcome Measure  Difficulty turning over in bed (including  adjusting bedclothes, sheets and blankets)?: Unable Difficulty moving from lying on back to sitting on the side of the bed? : Unable Difficulty sitting down on and standing up from a chair with arms (e.g., wheelchair, bedside commode, etc,.)?: Unable Help needed moving to and from a bed to chair (including a wheelchair)?: Total Help needed walking in hospital room?: Total Help needed climbing 3-5 steps with a railing? : Total 6 Click Score: 6    End of Session   Activity Tolerance: Patient limited by fatigue Patient left: in bed;with call bell/phone within reach;with family/visitor present   PT Visit Diagnosis: Hemiplegia and hemiparesis;Other symptoms and signs involving the nervous system (R29.898) Hemiplegia - Right/Left: Right Hemiplegia - dominant/non-dominant: Dominant Hemiplegia - caused by: Cerebral infarction     Time: 1127-1201 PT Time Calculation (min) (ACUTE ONLY): 34 min  Charges:  $Therapeutic Activity: 8-22 mins $Neuromuscular Re-education: 8-22 mins          Nikola Marone B. Syaire Saber, PT, DPT [phone]  04/23/2017, 12:13 PM

## 2017-04-23 NOTE — Progress Notes (Signed)
Initial Nutrition Assessment  DOCUMENTATION CODES:   Not applicable  INTERVENTION:   -Add Ensure Enlive po BID once diet advanced, each supplement provides 350 kcal and 20 grams of protein  -Noted family would not want long term feeding tube. If short term nutrition support is consistent with goals of care, recommend insertion of Cortrak tube with initiation of TF  Tube Feeding Recommendations:  Jevity 1.2 @50  ml/hr Provides 1440 kcals, 67 g of protein and 972 mL of free water  NUTRITION DIAGNOSIS:   Inadequate oral intake related to dysphagia, acute illness as evidenced by NPO status.  GOAL:   Patient will meet greater than or equal to 90% of their needs  MONITOR:   Diet advancement, Supplement acceptance, Labs, Weight trends  REASON FOR ASSESSMENT:   Malnutrition Screening Tool    ASSESSMENT:   84 yo female admitted with right-sided weakness, dysarthria and dysphagia with acute ischemic left MCA stroke. Pt with hx of HTN, HLD, a.fib on Eliquis   MBS performed yesterday; NPO status recommended  Family at bedside and report pt appetite prior to admission was good; pt eating 2-3 small meals per day and drinking 1 Ensure.  Family reports UBW around 130 but pt had lost down to 124 pounds and working to get back to 130. Bed weight obtained on visit today and pt with weight of 131 pounds.   Noted palliative care consulted; per neurology note, family would not want long term feeding tube  Labs: reviewed Meds: NS at 75 ml/hr  NUTRITION - FOCUSED PHYSICAL EXAM:    Most Recent Value  Orbital Region  No depletion  Upper Arm Region  Mild depletion  Thoracic and Lumbar Region  No depletion  Buccal Region  No depletion  Temple Region  No depletion  Clavicle Bone Region  No depletion  Clavicle and Acromion Bone Region  No depletion  Scapular Bone Region  No depletion  Dorsal Hand  Mild depletion  Patellar Region  Mild depletion  Anterior Thigh Region  Mild depletion   Posterior Calf Region  Moderate depletion       Diet Order:  Diet NPO time specified Fall precautions Aspiration precautions  EDUCATION NEEDS:   Not appropriate for education at this time  Skin:  Skin Assessment: Reviewed RN Assessment  Last BM:  3/10  Height:   Ht Readings from Last 1 Encounters:  04/21/17 5\' 4"  (1.626 m)    Weight:   Wt Readings from Last 1 Encounters:  04/23/17 131 lb (59.4 kg)    Ideal Body Weight:  54.54 kg  BMI:  Body mass index is 22.49 kg/m.  Estimated Nutritional Needs:   Kcal:  1350-1550 kcals  Protein:  70-83 g  Fluid:  >1.5L   Romelle Starcherate Calyssa Zobrist MS, RD, LDN, CNSC [phone] Pager  [phone] Weekend/On-Call Pager

## 2017-04-23 NOTE — Progress Notes (Signed)
Patients daughter is in room with patient overnight she asked if patient can have something to help her sleep, patient is NPO and .25 of ativan was written for, will continue to monitor as patient just had several runs of brady heart rate.

## 2017-04-23 NOTE — Care Management (Signed)
CM consulted for assistance with home Eliquis::   S/W ROBERT @ [facility] # [id number] OPT-2    1. ELIQUIS 2.5 MG BID  COVER- YES  CO-PAY- $ 45.00  TIER- 3 DRUG  PRIOR APPROVAL- NO    2. ELIQUIS 5 MG BID  COVER- YES  CO-PAY- $ 45.00  TIER- 3 DRUG  PRIOR APPROVAL- NO   NO DEDUCTIBLE   PREFERRED PHARMACY : [facility], [facility] AND [facility]

## 2017-04-23 NOTE — Progress Notes (Signed)
  Speech Language Pathology Treatment: Dysphagia;Cognitive-Linquistic  Patient Details Name: Courtney Reyes MRN: [id number] DOB: 09/25/1924 Today's Date: 04/23/2017 Time: [id number] SLP Time Calculation (min) (ACUTE ONLY): 25 min  Assessment / Plan / Recommendation Clinical Impression  Skilled treatment session focused on dysphagia and communication goals. SLP notified that pt's family would like a communication board to aid pt in expressing her wants/needs. SLP provided daughter with basic board however pt's prognosis for functionally using board is poor. Education provided to daughter that pt has severe inability to understand symbolic language (I.e., using a picture to communicate an idea) as well as visual deficits such as gaze preference. Pt was not able to demonstrate use during session. Although pt's daughter appears agreeable, she doesn't appear to understand - She made the comment that she would take the board to a "copy place" to have it enlarged for her mother to see it better given that pt wears glasses at baseline.   SLP further facilitated session by providing skilled observation of pt consuming ice chips and thin water via tsp. Pt oral manipulation of ice chips and multiple swallows initiated while having ice chip in mouth. Pt with appearance of fatigue after 3 chips and was inconsistent in communicating if she wanted any more. She indicated "yes" to tsp of water. Water yielded immediate coughing and RR up to 37. Pt able to recover but should remain NPO at this time. If pt's family desire comfort measures would recommend ice chips as pt requests. ST to follow acutely.    HPI HPI: Courtney I Holleyis a 84 y.o.femalewith medical history significantfor hypertension, hyperlipidemia, atrial fibrillation on Eliquis, history of hypertrophic cardiomyopathy and aortic regurgitation who presented to our emergency department this morning with right-sided weakness and dysarthria. Stroke head CT shows  no acute findings CTA of the head showed no large vessel occlusion but an acute moderate sized left frontoparietal MCA territory infarction.      SLP Plan  Continue with current plan of care       Recommendations  Diet recommendations: NPO Medication Administration: Via alternative means                Oral Care Recommendations: Oral care QID Follow up Recommendations: 24 hour supervision/assistance;Skilled Nursing facility SLP Visit Diagnosis: Dysphagia, unspecified (R13.10);Aphasia (R47.01) Plan: Continue with current plan of care       GO                Courtney Reyes 04/23/2017, 10:26 AM

## 2017-04-24 ENCOUNTER — Inpatient Hospital Stay (HOSPITAL_COMMUNITY): Payer: PPO

## 2017-04-24 DIAGNOSIS — Z515 Encounter for palliative care: Secondary | ICD-10-CM

## 2017-04-24 DIAGNOSIS — Z66 Do not resuscitate: Secondary | ICD-10-CM

## 2017-04-24 DIAGNOSIS — R1310 Dysphagia, unspecified: Secondary | ICD-10-CM

## 2017-04-24 MED ORDER — MORPHINE SULFATE (CONCENTRATE) 10 MG/0.5ML PO SOLN
5.0000 mg | ORAL | Status: DC | PRN
  Administered 2017-04-25: 5 mg via ORAL
  Filled 2017-04-24: qty 0.5

## 2017-04-24 MED ORDER — LORAZEPAM 1 MG PO TABS: 1.0000 mg | ORAL_TABLET | ORAL | Status: DC | PRN

## 2017-04-24 MED ORDER — RESOURCE THICKENUP CLEAR PO POWD
ORAL | Status: DC | PRN
  Filled 2017-04-24: qty 125

## 2017-04-24 NOTE — Progress Notes (Signed)
  Speech Language Pathology Treatment: Dysphagia;Cognitive-Linquistic  Patient Details Name: Shari HeritageMable I Jasek MRN: [id number] DOB: 09/17/1924 Today's Date: 04/24/2017 Time: [id number] SLP Time Calculation (min) (ACUTE ONLY): 26 min  Assessment / Plan / Recommendation Clinical Impression  At time of writing notes orders were discontinued due to plans for comfort measures, however after discussion with Palliative care, orders re-written due to pts excellent participation.   Pt alert and eager to participate in session. Turns head to the left to locate objects and make eye contact with left eye with the speaker. Pt follows one step verbal only commands and was able to count to 10 and repeat the phrase, "My name is Trinity Muscat[place] Holly" with moderate phonemic cues to reduce paraphasias and increase accuracy and intelligiblity.   SLP facilitated upright positioning and tactile cues to reduce labial spillage and limit flow of bolus. Otherwise, pt able to self feed sips of nectar thick liquids and ensure. Both tolerated well without signs of aspiration until more continuous sips allowed. Swallow response appeared subjectively delayed. Pt also tolerated purees very well and was able to masticate a graham cracker with appropriate caution. Min tactile cues needed to encourage lingual sweep.    Overall, pt demonstrates ability to not only consume modified textures with reduced risk of aspiration, but also to participate in therapeutic interventions to improve function and increase safety. Objective test recommended to guide treatment plan and determine best diet. Daughter and MD in agreement at bedside.    HPI HPI: Lanelle I Holleyis a 84 y.o.femalewith medical history significantfor hypertension, hyperlipidemia, atrial fibrillation on Eliquis, history of hypertrophic cardiomyopathy and aortic regurgitation who presented to our emergency department this morning with right-sided weakness and dysarthria. Stroke head CT  shows no acute findings CTA of the head showed no large vessel occlusion but an acute moderate sized left frontoparietal MCA territory infarction.      SLP Plan  MBS       Recommendations                   Plan: MBS       GO                Anoop Hemmer, Riley NearingBonnie Caroline 04/24/2017, 1:27 PM

## 2017-04-24 NOTE — Progress Notes (Signed)
   04/24/17 1100  Clinical Encounter Type  Visited With Patient and family together  Visit Type Initial  Referral From Nurse  Consult/Referral To Chaplain  Recommendations EOL Consult  Spiritual Encounters  Spiritual Needs Prayer  Stress Factors  Family Stress Factors Health changes  Chaplain visited with PT and family from consult.  OT was in the room and the PT was drinking liquids.  There was no expressed need.  The family seemed slightly encouraged and proud of the PT.  The family said they would reach out to Chaplain as need presented itself.

## 2017-04-24 NOTE — Progress Notes (Signed)
Modified Barium Swallow Progress Note  Patient Details  Name: Courtney Reyes MRN: [id number] Date of Birth: 01/26/1925  Today's Date: 04/24/2017  Modified Barium Swallow completed.  Full report located under Chart Review in the Imaging Section.  Brief recommendations include the following:  Clinical Impression  Pt demonstrates dramatic improvement in ability to participate in testing in contrast with prior study. Pt now self feeding and following directions for compensatory strategies. There is a finding of mild oral dysphagia, primarily due to decreased labial seal on the right with anterior spillage with cup sips and decreased intraoral pressure for full control with a straw. Lingual manipulation and propulsion is excellent however and pt does have ability to clear right buccal cavity of residuals. There is also a mild pharyngeal dysphagia with primary sensory deficits leading to delayed swallow intiation. Pt had only one instance of sensed aspiration before the swallow with thin liquids via straw. Nectar via cup and straw were both tolerated well despite pooling in the pyriforms prior to swallow. Recommend pt consume a dys 2 (ground) diet with nectar thick liquids with assist for single straw sips, upright posture and clearance of right buccal cavity as needed. Could trials thin liquid with max assist for small sips and controlled timing. Education provided to daughter. Will follow for further training and interventions.    Swallow Evaluation Recommendations       SLP Diet Recommendations: Dysphagia 2 (Fine chop) solids;Nectar thick liquid   Liquid Administration via: Straw   Medication Administration: Whole meds with puree   Supervision: Full supervision/cueing for compensatory strategies   Compensations: Slow rate;Small sips/bites;Lingual sweep for clearance of pocketing;Monitor for anterior loss;Follow solids with liquid   Postural Changes: Seated upright at 90 degrees   Oral Care  Recommendations: Oral care before and after PO   Other Recommendations: Order thickener from pharmacy;Have oral suction available   Harlon DittyBonnie Drianna Chandran, MA CCC-SLP [phone]  Claudine MoutonDeBlois, Pietro Bonura Caroline 04/24/2017,2:57 PM

## 2017-04-24 NOTE — Progress Notes (Signed)
PROGRESS NOTE    Courtney Reyes  ZOX:[id number] DOB: 03/15/1924 DOA: 04/21/2017 PCP: Courtney Dills, MD   Outpatient Specialists:     Brief Narrative:  Courtney Reyes is a 84 y.o. female with medical history significant for hypertension, hyperlipidemia, atrial fibrillation on Eliquis, history of hypertrophic cardiomyopathy and aortic regurgitation who presented to our emergency department this morning with right-sided weakness and dysarthria.  Found to have new CVA.  Comfort feeds and transition to home hospice vs residential hospice.     Assessment & Plan:   Principal Problem:   Acute ischemic left MCA stroke [facility]) Active Problems:   Aortic regurgitation   Apical variant hypertrophic cardiomyopathy (HCC)   PAF (paroxysmal atrial fibrillation) (HCC)   Chronic anticoagulation   Essential hypertension   Hyperlipidemia   Cataracts, bilateral   DNR (do not resuscitate)   Palliative care by specialist   Dysphagia     Acute ischemic left MCA stroke:  -echocardiogram: - Compared to a prior study in 2016, there are few changes. There   is moderate AI. There is evidence for apical hypertrophic   cardiomyopathy. The ascending aorta is dilated to 3.9 cm. The LA   is severely dialted. -neuro consult appreciated -poor overall prognosis-- discussed with neuro-- palliative care consult appreciated-- plan for comfort focus and possible residential hospice -long talk with daughter that comfort feeds after MBS will be started and patient is a high aspiration risk.  If family unable to provide 24 hour care and bring patient home with hospice, residential hospice would be best if she qualifies- re-assured daughter that Corrie Dandy from Palliative care would return in the AM and do another evaluation  Paroxysmal atrial fibrillation:  -comfort focused  H/o Aortic regurgitation:  -see above echo reading  Essential hypertension:  -permissive HTN  Hyperlipidemia:  -comfort focus  Bilateral  cataracts:  -Continue Xalatan and Cosopt drops      DVT prophylaxis:  SCD's  Code Status: DNR   Family Communication: daughter in room  Disposition Plan:  Residential hospice?  Consultants:  Neuro Palliative care  Subjective: More sleepy today but will awaken and try to communicate.  Daughter says her mother has been talking about "going home" referring to heaven  Objective: Vitals:   04/24/17 0345 04/24/17 0800 04/24/17 0848 04/24/17 1158  BP: (!) 145/78  (!) 150/84   Pulse: 79  (!) 53   Resp: 20  (!) 31   Temp: 98.2 F (36.8 C) 97.8 F (36.6 C) 97.9 F (36.6 C) (P) 98.3 F (36.8 C)  TempSrc: Axillary   (P) Oral  SpO2: 100%  100%   Weight:      Height:        Intake/Output Summary (Last 24 hours) at 04/24/2017 1331 Last data filed at 04/24/2017 0403 Gross per 24 hour  Intake 480 ml  Output -  Net 480 ml   Filed Weights   04/21/17 0857 04/23/17 1439  Weight: 64 kg (141 lb 1.5 oz) 59.4 kg (131 lb)    Examination:  General exam: in bed, using left arm to move right wrist Respiratory system: diminished but no increased work of breathing Gastrointestinal system: +Bs, soft Central nervous system: not moving right leg as well     Data Reviewed: I have personally reviewed following labs and imaging studies  CBC: Recent Labs  Lab 04/21/17 0813 04/21/17 0822  WBC 4.3  --   NEUTROABS 1.7  --   HGB 11.8* 12.2  HCT 36.2 36.0  MCV 96.3  --  PLT 208  --    Basic Metabolic Panel: Recent Labs  Lab 04/21/17 0813 04/21/17 0822  NA 139 142  K 3.7 3.6  CL 106 106  CO2 23  --   GLUCOSE 130* 127*  BUN 15 17  CREATININE 0.97 0.90  CALCIUM 8.9  --    GFR: Estimated Creatinine Clearance: 35.9 mL/min (by C-G formula based on SCr of 0.9 mg/dL). Liver Function Tests: Recent Labs  Lab 04/21/17 0813  AST 25  ALT 10*  ALKPHOS 70  BILITOT 0.6  PROT 6.7  ALBUMIN 2.9*   No results for input(s): LIPASE, AMYLASE in the last 168 hours. No results  for input(s): AMMONIA in the last 168 hours. Coagulation Profile: Recent Labs  Lab 04/21/17 0813  INR 1.18   Cardiac Enzymes: No results for input(s): CKTOTAL, CKMB, CKMBINDEX, TROPONINI in the last 168 hours. BNP (last 3 results) No results for input(s): PROBNP in the last 8760 hours. HbA1C: No results for input(s): HGBA1C in the last 72 hours. CBG: Recent Labs  Lab 04/21/17 0810  GLUCAP 115*   Lipid Profile: No results for input(s): CHOL, HDL, LDLCALC, TRIG, CHOLHDL, LDLDIRECT in the last 72 hours. Thyroid Function Tests: No results for input(s): TSH, T4TOTAL, FREET4, T3FREE, THYROIDAB in the last 72 hours. Anemia Panel: No results for input(s): VITAMINB12, FOLATE, FERRITIN, TIBC, IRON, RETICCTPCT in the last 72 hours. Urine analysis: No results found for: COLORURINE, APPEARANCEUR, LABSPEC, PHURINE, GLUCOSEU, HGBUR, BILIRUBINUR, KETONESUR, PROTEINUR, UROBILINOGEN, NITRITE, LEUKOCYTESUR  )No results found for this or any previous visit (from the past 240 hour(s)).    Anti-infectives (From admission, onward)   None       Radiology Studies: No results found.      Scheduled Meds: . dorzolamide-timolol  1 drop Both Eyes BID  . latanoprost  1 drop Both Eyes QHS  . LORazepam  0.25 mg Oral Once   Continuous Infusions: . sodium chloride 75 mL/hr at 04/23/17 1609     LOS: 3 days    Time spent: 25 min    Joseph ArtJessica U Jiah Bari, DO [facility] Pager [phone]  If 7PM-7AM, please contact night-coverage [email or web address] Password [email or web address] 04/24/2017, 1:31 PM

## 2017-04-24 NOTE — Consult Note (Signed)
Consultation Note Date: 04/24/2017   Patient Name: Courtney Reyes  DOB: 17-Mar-1924  MRN: [id number]  Age / Sex: 84 y.o., female  PCP: Renford Dills, MD Referring Physician: Joseph Art, DO  Reason for Consultation: Establishing goals of care and Psychosocial/spiritual support  HPI/Patient Profile: 84 y.o. female  admitted on 04/21/2017 with PMH of HTN, HLD, AFIB on Eliquis, Hx of hypertrophic cardiomyopathy, and aortic regurgitation who presents with acute onset Right sided weakness and aphasia.   Family reports patient woke up at approximately 7 AM, spoke to her daughter from her bed. At 7:40 AM daughter heard patient fall out of her bed, she had right sided weakness and aphasia, EMS was called and patient transported to ED.    CTA shows no  LVO and Acute, small to moderate-sized left frontoparietal MCA territory infarct. 1.4 mismatch ratio.   Patient has advanced directive documenting desire for a natural death.  Family face advance directive decisions and anticipatory care needs.   Clinical Assessment and Goals of Care:  This NP Lorinda Creed reviewed medical records, received report from team, assessed the patient and then meet at the patient's bedside along with her two daughters  to discuss diagnosis, prognosis, GOC, EOL wishes disposition and options.  Concept of Hospice and Palliative Care were discussed  A detailed discussion was had today regarding advanced directives.  Concepts specific to code status, artifical feeding and hydration, continued IV antibiotics and rehospitalization was had.  The difference between a aggressive medical intervention path  and a palliative comfort care path for this patient at this time was had.  Values and goals of care important to patient and family were attempted to be elicited.  MOST form introduced  Natural trajectory and expectations at EOL were discussed.   Questions and concerns addressed.   Family encouraged to call with questions or concerns.    PMT will continue to support holistically.   NEXT OF KIN    SUMMARY OF RECOMMENDATIONS    Code Status/Advance Care Planning:  DNR    Symptom Management:   Dyspnea: Roxanol 5 mg po/sl every 2 hrs prn  Agitation: Ativan 1 mg every 4 hrs prn  Dysphagia: comfort feeds as tolerated with known risk for aspiration  Palliative Prophylaxis:   Aspiration, Bowel Regimen, Delirium Protocol, Frequent Pain Assessment and Oral Care  Additional Recommendations (Limitations, Scope, Preferences):  Full Comfort Care  Psycho-social/Spiritual:   Desire for further Chaplaincy support:yes  Additional Recommendations: Education on Hospice  Prognosis:   Will reevaluate in the morning prognosis will be dependent on patient's oral intake., focus of care is comfort, quality and digntiy  Discharge Planning: If patient's prognosis is deemed to be less than 2 weeks will make referral to residential hospice in the am    To Be Determined      Primary Diagnoses: Present on Admission: . Acute ischemic left MCA stroke (HCC) . PAF (paroxysmal atrial fibrillation) (HCC) . Aortic regurgitation . Cataracts, bilateral   I have reviewed the medical record, interviewed the patient  and family, and examined the patient. The following aspects are pertinent.  Past Medical History:  Diagnosis Date  . Aortic regurgitation 03/16/2015  . Apical variant hypertrophic cardiomyopathy (HCC) 03/16/2015  . Cataracts, bilateral   . Cerebral aneurysm   . Hypercholesteremia   . Hypertension   . Osteoarthritis   . PAF (paroxysmal atrial fibrillation) (HCC) 03/23/2015   Social History   Socioeconomic History  . Marital status: Married    Spouse name: None  . Number of children: None  . Years of education: None  . Highest education level: None  Social Needs  . Financial resource strain: None  . Food insecurity -  worry: None  . Food insecurity - inability: None  . Transportation needs - medical: None  . Transportation needs - non-medical: None  Occupational History  . None  Tobacco Use  . Smoking status: Never Smoker  . Smokeless tobacco: Never Used  Substance and Sexual Activity  . Alcohol use: No    Alcohol/week: 0.0 oz  . Drug use: No  . Sexual activity: None  Other Topics Concern  . None  Social History Narrative  . None   Family History  Problem Relation Age of Onset  . Healthy Father   . Healthy Mother    Scheduled Meds: . aspirin EC  325 mg Oral Daily   Or  . aspirin  300 mg Rectal Daily  . dorzolamide-timolol  1 drop Both Eyes BID  . latanoprost  1 drop Both Eyes QHS  . LORazepam  0.25 mg Oral Once   Continuous Infusions: . sodium chloride 75 mL/hr at 04/23/17 1609   PRN Meds:.acetaminophen **OR** acetaminophen (TYLENOL) oral liquid 160 mg/5 mL **OR** acetaminophen, artificial tears Medications Prior to Admission:  Prior to Admission medications   Medication Sig Start Date End Date Taking? Authorizing Provider  allopurinol (ZYLOPRIM) 100 MG tablet Take 100 mg by mouth daily.   Yes [provider]  atenolol (TENORMIN) 50 MG tablet Take 100 mg by mouth daily. For SBP greater than 140 11/28/16  Yes [provider]  chlorthalidone (HYGROTON) 25 MG tablet Take 25 mg by mouth daily as needed (elevated BP or swelling).    Yes [provider]  dorzolamide-timolol (COSOPT) 22.3-6.8 MG/ML ophthalmic solution Place 1 drop into both eyes 2 (two) times daily. 12/27/14  Yes [provider]  OVER THE COUNTER MEDICATION Take 2 tablets by mouth daily. Vision Clear B   Yes [provider]  OVER THE COUNTER MEDICATION Take 1 tablet by mouth daily. Vision Clear A   Yes [provider]  POLYETHYL GLYCOL-PROPYL GLYCOL OP Apply 1-2 drops to eye daily as needed (dry eyes).   Yes [provider]  potassium chloride (K-DUR) 10 MEQ  tablet Take 10 mEq by mouth 2 (two) times daily.    Yes [provider]  acetaminophen (TYLENOL 8 HOUR) 650 MG CR tablet Take 1 tablet (650 mg total) by mouth every 8 (eight) hours as needed for pain. 03/23/15   Robbie LisSimmons, Brittainy M, PA-C  ELIQUIS 2.5 MG TABS tablet TAKE 1 TABLET BY MOUTH 2 TIMES DAILY 02/03/16   Lyn RecordsSmith, Henry W, MD  latanoprost (XALATAN) 0.005 % ophthalmic solution Place 1 drop into both eyes at bedtime.  12/27/14   [provider]   Allergies  Allergen Reactions  . Aleve [Naproxen Sodium] Hives   Review of Systems  Unable to perform ROS   Physical Exam  Constitutional: She appears well-developed. She appears lethargic. She appears ill.  Nasal cannula in place.  Cardiovascular: Normal rate, regular rhythm and normal heart sounds.  Pulmonary/Chest: She has decreased breath sounds.  Neurological: She appears lethargic.  Skin: Skin is warm and dry.    Vital Signs: BP (!) 145/78 (BP Location: Left Arm)   Pulse 79   Temp 97.8 F (36.6 C)   Resp 20   Ht 5\' 4"  (1.626 m)   Wt 59.4 kg (131 lb)   SpO2 100%   BMI 22.49 kg/m  Pain Assessment: 0-10   Pain Score: 0-No pain   SpO2: SpO2: 100 % O2 Device:SpO2: 100 % O2 Flow Rate: .   IO: Intake/output summary:   Intake/Output Summary (Last 24 hours) at 04/24/2017 0834 Last data filed at 04/24/2017 0403 Gross per 24 hour  Intake 480 ml  Output -  Net 480 ml    LBM: Last BM Date: 04/21/17 Baseline Weight: Weight: 64 kg (141 lb 1.5 oz) Most recent weight: Weight: 59.4 kg (131 lb)     Palliative Assessment/Data: 30% at best   Discussed with Dr Benjamine Mola  Time In: 0730 Time Out: 0845 Time Total: 75 min Greater than 50%  of this time was spent counseling and coordinating care related to the above assessment and plan.  Signed by: Lorinda Creed, NP   Please contact Palliative Medicine Team phone at [phone] for questions and concerns.  For individual provider: See Loretha Stapler

## 2017-04-24 NOTE — Progress Notes (Signed)
OT Cancellation Note  Patient Details Name: Shari HeritageMable I Clayborn MRN: [id number] DOB: 04/24/1924   Cancelled Treatment:    Reason Eval/Treat Not Completed: Medical issues which prohibited therapy;Other (comment)(OT orders discontinued)  Galen ManilaSpencer, Gerianne Simonet Jeanette 04/24/2017, 11:11 AM

## 2017-04-24 NOTE — Progress Notes (Addendum)
Patient was de-sating this morning down to 70's put [place] at 2 litre's and now at 100%. It appears that she does not breath sometimes when she is in very deep sleep. Sleep apnea?

## 2017-04-25 DIAGNOSIS — I63512 Cerebral infarction due to unspecified occlusion or stenosis of left middle cerebral artery: Secondary | ICD-10-CM

## 2017-04-25 DIAGNOSIS — I351 Nonrheumatic aortic (valve) insufficiency: Secondary | ICD-10-CM

## 2017-04-25 MED ORDER — LORAZEPAM 1 MG PO TABS: 1.0000 mg | ORAL_TABLET | ORAL | Status: AC | PRN

## 2017-04-25 MED ORDER — MORPHINE SULFATE (CONCENTRATE) 10 MG/0.5ML PO SOLN: 5.0000 mg | ORAL | Status: AC | PRN

## 2017-04-25 MED ORDER — MORPHINE SULFATE (CONCENTRATE) 10 MG/0.5ML PO SOLN: 5.0000 mg | Freq: Four times a day (QID) | ORAL | Status: DC

## 2017-04-25 MED ORDER — MORPHINE SULFATE (CONCENTRATE) 10 MG/0.5ML PO SOLN: 5.0000 mg | Freq: Four times a day (QID) | ORAL | Status: AC

## 2017-04-25 NOTE — Discharge Summary (Signed)
Physician Discharge Summary  Courtney Reyes ZOX:[id number] DOB: 08-01-1924 DOA: 04/21/2017  PCP: Renford Dills, MD  Admit date: 04/21/2017 Discharge date: 04/25/2017  Admitted From: Home Disposition: Residential hospice  Recommendations for Outpatient Follow-up:  1. Follow up with residents hospice at earliest convenience 2.  Home Health: No Equipment/Devices: None  Discharge Condition: Poor CODE STATUS: DNR Diet recommendation: Comfort feed  Brief/Interim Summary: 84 year old female with history of hypertension, hyperlipidemia, atrial fibrillation on Eliquis, hypertrophic cardiomyopathy and aortic regurgitation presented on 04/21/2017 with right-sided weakness and dysarthria.  She was found to have CVA.  Neurology was consulted.  Because of overall very poor prognosis, palliative care was also consulted.  She was made comfort measures.  All other medications have been discontinued except for comfort measures medications.  She will be discharged to residential hospice.   Discharge Diagnoses:  Principal Problem:   Acute ischemic left MCA stroke [facility]) Active Problems:   Aortic regurgitation   Apical variant hypertrophic cardiomyopathy (HCC)   PAF (paroxysmal atrial fibrillation) (HCC)   Chronic anticoagulation   Essential hypertension   Hyperlipidemia   Cataracts, bilateral   DNR (do not resuscitate)   Palliative care by specialist   Dysphagia   Acute ischemic left MCA stroke  Paroxysmal atrial fibrillation:   H/o Aortic regurgitation:   Essential hypertension:   Hyperlipidemia:   Bilateral cataracts:    Hospital course and plan: As described above in brief summary     Discharge Instructions  Discharge Instructions    Bed rest   Complete by:  As directed    Diet - low sodium heart healthy   Complete by:  As directed    Discharge instructions   Complete by:  As directed    Comfort feeds     Allergies as of 04/25/2017      Reactions   Aleve [naproxen  Sodium] Hives      Medication List    STOP taking these medications   allopurinol 100 MG tablet Commonly known as:  ZYLOPRIM   atenolol 50 MG tablet Commonly known as:  TENORMIN   chlorthalidone 25 MG tablet Commonly known as:  HYGROTON   ELIQUIS 2.5 MG Tabs tablet Generic drug:  apixaban   OVER THE COUNTER MEDICATION   OVER THE COUNTER MEDICATION   potassium chloride 10 MEQ tablet Commonly known as:  K-DUR     TAKE these medications   acetaminophen 650 MG CR tablet Commonly known as:  TYLENOL 8 HOUR Take 1 tablet (650 mg total) by mouth every 8 (eight) hours as needed for pain.   dorzolamide-timolol 22.3-6.8 MG/ML ophthalmic solution Commonly known as:  COSOPT Place 1 drop into both eyes 2 (two) times daily.   latanoprost 0.005 % ophthalmic solution Commonly known as:  XALATAN Place 1 drop into both eyes at bedtime.   LORazepam 1 MG tablet Commonly known as:  ATIVAN Take 1 tablet (1 mg total) by mouth every 4 (four) hours as needed (agitation).   morphine CONCENTRATE 10 MG/0.5ML Soln concentrated solution Take 0.25 mLs (5 mg total) by mouth every 6 (six) hours.   morphine CONCENTRATE 10 MG/0.5ML Soln concentrated solution Take 0.25 mLs (5 mg total) by mouth every hour as needed for moderate pain or shortness of breath.   POLYETHYL GLYCOL-PROPYL GLYCOL OP Apply 1-2 drops to eye daily as needed (dry eyes).       Allergies  Allergen Reactions  . Aleve [Naproxen Sodium] Hives    Consultations:  Neurology/palliative care   Procedures/Studies: Ct Angio  Head W Or Wo Contrast  Addendum Date: 04/21/2017   ADDENDUM REPORT: 04/21/2017 09:45 ADDENDUM: Interlobular septal thickening in the lung apices with small pleural effusions. Consider obtaining chest radiographs to evaluate for edema/CHF. Electronically Signed   By: Sebastian Ache M.D.   On: 04/21/2017 09:45   Result Date: 04/21/2017 CLINICAL DATA:  Right-sided weakness and aphasia. EXAM: CT ANGIOGRAPHY  HEAD AND NECK CT PERFUSION BRAIN TECHNIQUE: Multidetector CT imaging of the head and neck was performed using the standard protocol during bolus administration of intravenous contrast. Multiplanar CT image reconstructions and MIPs were obtained to evaluate the vascular anatomy. Carotid stenosis measurements (when applicable) are obtained utilizing NASCET criteria, using the distal internal carotid diameter as the denominator. Multiphase CT imaging of the brain was performed following IV bolus contrast injection. Subsequent parametric perfusion maps were calculated using RAPID software. CONTRAST:  90mL ISOVUE-370 IOPAMIDOL (ISOVUE-370) INJECTION 76% COMPARISON:  None. FINDINGS: CTA NECK FINDINGS Aortic arch: Normal variant aortic arch branching pattern with common origin of the brachiocephalic and left common carotid arteries and with the left vertebral artery arising from the arch distal to the left subclavian artery. Mild arch atherosclerosis. Widely patent brachiocephalic and subclavian arteries. Right carotid system: The common carotid artery is patent proximally but demonstrates progressively diminishing contrast opacification distally without significant opacification of the carotid bifurcation. The internal carotid artery is occluded throughout the neck. No significant ECA opacification is identified either. Left carotid system: Patent without evidence of stenosis or dissection. Tortuous mid cervical ICA. Vertebral arteries: Patent without evidence of stenosis or dissection. Strongly dominant left vertebral artery. Skeleton: Moderately advanced diffuse cervical disc degeneration. Other neck: No mass or enlarged lymph nodes. Upper chest: Motion artifact in the lung apices with bronchial wall thickening and interlobular septal thickening. Mild subpleural opacity may reflect atelectasis and/or mild fibrosis. Partially visualized small pleural effusions. Review of the MIP images confirms the above findings CTA  HEAD FINDINGS Anterior circulation: The intracranial right ICA is occluded proximally with reconstitution of the supraclinoid segment via the right posterior communicating artery. A right supraclinoid ICA aneurysm projecting posteriorly measures 10 x 8 mm. 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 There is no residual opacification of this aneurysm. The intracranial left ICA is widely patent. The left cavernous ICA is tortuous with a 4 mm superiorly projecting aneurysm noted proximally. The right A1 segment is absent or occluded. The left ACA is widely patent and supplies the right. The MCAs are patent without M1 stenosis. Major M2 branches appear patent proximally, however there is a decreased number of distal left MCA branch vessels corresponding to the area of infarct demonstrated on perfusion imaging. Posterior circulation: The intracranial vertebral arteries are patent to the basilar. There is mild right V4 stenosis. A long segment fenestration is incidentally noted extending from the V3 to proximal V4 segments on the right. The basilar artery is widely patent. There is a fetal origin of the left PCA. A relatively large right posterior communicating artery is also present. No significant proximal PCA stenosis is seen. There is a variant vessel extending between the distal basilar artery and left A1 segment. No aneurysm. Venous sinuses: As permitted by contrast timing, patent. Anatomic variants: Multiple variants as detailed above. Delayed phase: Not performed. Review of the MIP images confirms the above findings CT Brain Perfusion Findings: CBF (<30%) Volume: 35mL Perfusion (Tmax>6.0s) volume: 49mL Mismatch Volume:  14mL Infarction Location:Left frontoparietal cortex and white matter beginning at the posterior aspect of the operculum and extending superiorly. IMPRESSION: 1. Acute,  small to moderate-sized left frontoparietal MCA territory infarct. 1.4 mismatch ratio. 2. No acute large vessel occlusion. Decreased number of distal left MCA branch vessels corresponding to the acute infarct. 3. 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 4. Reconstitution of the supraclinoid right ICA. 10 x 8 mm right supraclinoid ICA aneurysm. 5. 4 mm left cavernous ICA aneurysm. 6. Widely patent left common and internal carotid arteries. 7. Widely patent vertebral arteries. The study was reviewed in person with Dr. Amada Jupiter on 04/21/2017 at 8:45 a.m. Electronically Signed: By: Sebastian Ache M.D. On: 04/21/2017 09:34   Ct Angio Neck W Or Wo Contrast  Addendum Date: 04/21/2017   ADDENDUM REPORT: 04/21/2017 09:45 ADDENDUM: Interlobular septal thickening in the lung apices with small pleural effusions. Consider obtaining chest radiographs to evaluate for edema/CHF. Electronically Signed   By: Sebastian Ache M.D.   On: 04/21/2017 09:45   Result Date: 04/21/2017 CLINICAL DATA:  Right-sided weakness and aphasia. EXAM: CT ANGIOGRAPHY HEAD AND NECK CT PERFUSION BRAIN TECHNIQUE: Multidetector CT imaging of the head and neck was performed using the standard protocol during bolus administration of intravenous contrast. Multiplanar CT image reconstructions and MIPs were obtained to evaluate the vascular anatomy. Carotid stenosis measurements (when applicable) are obtained utilizing NASCET criteria, using the distal internal carotid diameter as the denominator. Multiphase CT imaging of the brain was performed following IV bolus contrast injection. Subsequent parametric perfusion maps were calculated using RAPID software. CONTRAST:  90mL ISOVUE-370 IOPAMIDOL (ISOVUE-370) INJECTION 76% COMPARISON:  None. FINDINGS: CTA NECK FINDINGS Aortic arch: Normal variant aortic arch branching pattern with common origin of the brachiocephalic and left common carotid arteries  and with the left vertebral artery arising from the arch distal to the left subclavian artery. Mild arch atherosclerosis. Widely patent brachiocephalic and subclavian arteries. Right carotid system: The common carotid artery is patent proximally but demonstrates progressively diminishing contrast opacification distally without significant opacification of the carotid bifurcation. The internal carotid artery is occluded throughout the neck. No significant ECA opacification is identified either. Left carotid system: Patent without evidence of stenosis or dissection. Tortuous mid cervical ICA. Vertebral arteries: Patent without evidence of stenosis or dissection. Strongly dominant left vertebral artery. Skeleton: Moderately advanced diffuse cervical disc degeneration. Other neck: No mass or enlarged lymph nodes. Upper chest: Motion artifact in the lung apices with bronchial wall thickening and interlobular septal thickening. Mild subpleural opacity may reflect atelectasis and/or mild fibrosis. Partially visualized small pleural effusions. Review of the MIP images confirms the above findings CTA HEAD FINDINGS Anterior circulation: The intracranial right ICA is occluded proximally with reconstitution of the supraclinoid segment via the right posterior communicating artery. A right supraclinoid ICA aneurysm projecting posteriorly measures 10 x 8 mm. 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 There is no residual opacification of this aneurysm. The intracranial left ICA is widely patent. The left cavernous ICA is tortuous with a 4 mm superiorly projecting aneurysm noted proximally. The right A1 segment is absent or occluded. The left ACA is widely patent and supplies the right. The MCAs are patent without M1 stenosis. Major M2 branches appear patent proximally, however there is a decreased number  of distal left MCA branch vessels corresponding  to the area of infarct demonstrated on perfusion imaging. Posterior circulation: The intracranial vertebral arteries are patent to the basilar. There is mild right V4 stenosis. A long segment fenestration is incidentally noted extending from the V3 to proximal V4 segments on the right. The basilar artery is widely patent. There is a fetal origin of the left PCA. A relatively large right posterior communicating artery is also present. No significant proximal PCA stenosis is seen. There is a variant vessel extending between the distal basilar artery and left A1 segment. No aneurysm. Venous sinuses: As permitted by contrast timing, patent. Anatomic variants: Multiple variants as detailed above. Delayed phase: Not performed. Review of the MIP images confirms the above findings CT Brain Perfusion Findings: CBF (<30%) Volume: 35mL Perfusion (Tmax>6.0s) volume: 49mL Mismatch Volume: 14mL Infarction Location:Left frontoparietal cortex and white matter beginning at the posterior aspect of the operculum and extending superiorly. IMPRESSION: 1. Acute, small to moderate-sized left frontoparietal MCA territory infarct. 1.4 mismatch ratio. 2. No acute large vessel occlusion. Decreased number of distal left MCA branch vessels corresponding to the acute infarct. 3. 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 4. Reconstitution of the supraclinoid right ICA. 10 x 8 mm right supraclinoid ICA aneurysm. 5. 4 mm left cavernous ICA aneurysm. 6. Widely patent left common and internal carotid arteries. 7. Widely patent vertebral arteries. The study was reviewed in person with Dr. Amada JupiterKirkpatrick on 04/21/2017 at 8:45 a.m. Electronically Signed: By: Sebastian AcheAllen  Grady M.D. On: 04/21/2017 09:34   Mr Brain Wo Contrast  Result Date: 04/22/2017 CLINICAL DATA:  Stroke, right-sided weakness EXAM: MRI HEAD WITHOUT CONTRAST TECHNIQUE: Multiplanar,  multiecho pulse sequences of the brain and surrounding structures were obtained without intravenous contrast. COMPARISON:  CTA 04/21/2017 FINDINGS: Brain: Acute infarct left parietal lobe corresponds closely to the CT perfusion abnormality. No acute infarct on the right. Mild atrophy.  Negative for hydrocephalus.  Negative for hemorrhage. Image quality degraded by motion. Septated cystic lesion in the right cavernous chronic sinus measures 24 x 21 mm. This does not show enhancement on the CT Vascular: Right internal carotid artery is occluded with loss of flow void. 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 Otherwise normal flow voids. Skull and upper cervical spine: Negative Sinuses/Orbits: Paranasal sinuses clear.  Bilateral cataract removal Other: None IMPRESSION: Acute infarct left parietal lobe, this corresponds closely to the CT perfusion abnormality. No other acute infarct Mild generalized atrophy. 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 24 x 21 mm cyst in the right cavernous sinus. There appears to be some remodeling of the skull base on CT in this area. Possible arachnoid cyst Electronically Signed   By: Marlan Palauharles  Clark M.D.   On: 04/22/2017 12:45   Ct Cerebral Perfusion W Contrast  Addendum Date: 04/21/2017   ADDENDUM REPORT: 04/21/2017 09:45 ADDENDUM: Interlobular septal thickening in the lung apices with small pleural effusions. Consider obtaining chest radiographs to evaluate for edema/CHF. Electronically Signed   By: Sebastian AcheAllen  Grady M.D.   On: 04/21/2017 09:45   Result Date: 04/21/2017 CLINICAL DATA:  Right-sided weakness and aphasia. EXAM: CT ANGIOGRAPHY HEAD AND NECK CT PERFUSION BRAIN TECHNIQUE: Multidetector CT imaging of the head and neck was performed using the standard protocol during bolus administration of intravenous contrast. Multiplanar CT image reconstructions and MIPs were obtained to  evaluate the vascular anatomy. Carotid stenosis measurements (when applicable)  are obtained utilizing NASCET criteria, using the distal internal carotid diameter as the denominator. Multiphase CT imaging of the brain was performed following IV bolus contrast injection. Subsequent parametric perfusion maps were calculated using RAPID software. CONTRAST:  90mL ISOVUE-370 IOPAMIDOL (ISOVUE-370) INJECTION 76% COMPARISON:  None. FINDINGS: CTA NECK FINDINGS Aortic arch: Normal variant aortic arch branching pattern with common origin of the brachiocephalic and left common carotid arteries and with the left vertebral artery arising from the arch distal to the left subclavian artery. Mild arch atherosclerosis. Widely patent brachiocephalic and subclavian arteries. Right carotid system: The common carotid artery is patent proximally but demonstrates progressively diminishing contrast opacification distally without significant opacification of the carotid bifurcation. The internal carotid artery is occluded throughout the neck. No significant ECA opacification is identified either. Left carotid system: Patent without evidence of stenosis or dissection. Tortuous mid cervical ICA. Vertebral arteries: Patent without evidence of stenosis or dissection. Strongly dominant left vertebral artery. Skeleton: Moderately advanced diffuse cervical disc degeneration. Other neck: No mass or enlarged lymph nodes. Upper chest: Motion artifact in the lung apices with bronchial wall thickening and interlobular septal thickening. Mild subpleural opacity may reflect atelectasis and/or mild fibrosis. Partially visualized small pleural effusions. Review of the MIP images confirms the above findings CTA HEAD FINDINGS Anterior circulation: The intracranial right ICA is occluded proximally with reconstitution of the supraclinoid segment via the right posterior communicating artery. A right supraclinoid ICA aneurysm projecting posteriorly measures 10 x  8 mm. 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 There is no residual opacification of this aneurysm. The intracranial left ICA is widely patent. The left cavernous ICA is tortuous with a 4 mm superiorly projecting aneurysm noted proximally. The right A1 segment is absent or occluded. The left ACA is widely patent and supplies the right. The MCAs are patent without M1 stenosis. Major M2 branches appear patent proximally, however there is a decreased number of distal left MCA branch vessels corresponding to the area of infarct demonstrated on perfusion imaging. Posterior circulation: The intracranial vertebral arteries are patent to the basilar. There is mild right V4 stenosis. A long segment fenestration is incidentally noted extending from the V3 to proximal V4 segments on the right. The basilar artery is widely patent. There is a fetal origin of the left PCA. A relatively large right posterior communicating artery is also present. No significant proximal PCA stenosis is seen. There is a variant vessel extending between the distal basilar artery and left A1 segment. No aneurysm. Venous sinuses: As permitted by contrast timing, patent. Anatomic variants: Multiple variants as detailed above. Delayed phase: Not performed. Review of the MIP images confirms the above findings CT Brain Perfusion Findings: CBF (<30%) Volume: 35mL Perfusion (Tmax>6.0s) volume: 49mL Mismatch Volume: 14mL Infarction Location:Left frontoparietal cortex and white matter beginning at the posterior aspect of the operculum and extending superiorly. IMPRESSION: 1. Acute, small to moderate-sized left frontoparietal MCA territory infarct. 1.4 mismatch ratio. 2. No acute large vessel occlusion. Decreased number of distal left MCA branch vessels corresponding to the acute infarct. 3. Chronic occlusion of the right ICA related  to remote treatment of a large cavernous region ICA aneurysm. 4. Reconstitution of the supraclinoid right ICA. 10 x 8 mm right supraclinoid ICA aneurysm. 5. 4 mm left cavernous ICA aneurysm. 6. Widely patent left common and internal carotid arteries. 7. Widely patent vertebral arteries. The study was  reviewed in person with Dr. Amada Jupiter on 04/21/2017 at 8:45 a.m. Electronically Signed: By: Sebastian Ache M.D. On: 04/21/2017 09:34   Dg Chest Port 1 View  Result Date: 04/22/2017 CLINICAL DATA:  Short of breath EXAM: PORTABLE CHEST 1 VIEW COMPARISON:  04/21/2017 FINDINGS: The heart remains severely enlarged. The thorax is rotated. Evaluation of the mediastinum is limited. Prominence of the central vessels is again noted. Peripheral pulmonary vascularity is within normal limits. No pneumothorax. No pleural effusion. Lungs are grossly clear allowing for rotation. IMPRESSION: Stable cardiomegaly without decompensation. Electronically Signed   By: Jolaine Click M.D.   On: 04/22/2017 07:56   Dg Chest Port 1 View  Result Date: 04/21/2017 CLINICAL DATA:  CHF EXAM: PORTABLE CHEST 1 VIEW COMPARISON:  None. FINDINGS: Cardiomegaly. No overt edema. No confluent opacity or effusion. No acute bony abnormality. IMPRESSION: Cardiomegaly.  No acute findings. Electronically Signed   By: Charlett Nose M.D.   On: 04/21/2017 10:37   Dg Swallowing Func-speech Pathology  Result Date: 04/24/2017 Objective Swallowing Evaluation: Type of Study: MBS-Modified Barium Swallow Study  Patient Details Name: ROSALBA TOTTY MRN: [id number] Date of Birth: 06/30/1924 Today's Date: 04/24/2017 Time: SLP Start Time (ACUTE ONLY): 1200 -SLP Stop Time (ACUTE ONLY): 1230 SLP Time Calculation (min) (ACUTE ONLY): 30 min Past Medical History: Past Medical History: Diagnosis Date . Aortic regurgitation 03/16/2015 . Apical variant hypertrophic cardiomyopathy (HCC) 03/16/2015 . Cataracts, bilateral  . Cerebral aneurysm  . Hypercholesteremia  . Hypertension  .  Osteoarthritis  . PAF (paroxysmal atrial fibrillation) (HCC) 03/23/2015 Past Surgical History: No past surgical history on file. HPI: Therese I Holleyis a 84 y.o.femalewith medical history significantfor hypertension, hyperlipidemia, atrial fibrillation on Eliquis, history of hypertrophic cardiomyopathy and aortic regurgitation who presented to our emergency department this morning with right-sided weakness and dysarthria. Stroke head CT shows no acute findings CTA of the head showed no large vessel occlusion but an acute moderate sized left frontoparietal MCA territory infarction.  Subjective: Pt seen in radiology for MBS to objectively assess swallow function and safety, and identify least restrictive diet. Pt awake, left gaze preference. No family present Assessment / Plan / Recommendation CHL IP CLINICAL IMPRESSIONS 04/24/2017 Clinical Impression  Pt demonstrates dramatic improvement in ability to participate in testing in contrast with prior study. Pt now self feeding and following directions for compensatory strategies. There is a finding of mild oral dysphagia, primarily due to decreased labial seal on the right with anterior spillage with cup sips and decreased intraoral pressure for full control with a straw. Lingual manipulation and propulsion is excellent however and pt does have ability to clear right buccal cavity of residuals. There is also a mild pharyngeal dysphagia with primary sensory deficits leading to delayed swallow intiation. Pt had only one instance of sensed aspiration before the swallow with thin liquids via straw. Nectar via cup and straw were both tolerated well despite pooling in the pyriforms prior to swallow. Recommend pt consume a dys 2 (ground) diet with nectar thick liquids with assist for single straw sips, upright posture and clearance of right buccal cavity as needed. Could trials thin liquid with max assist for small sips and controlled timing. Education provided to daughter.  Will follow for further training and interventions.   SLP Visit Diagnosis Dysphagia, oropharyngeal phase (R13.12) Attention and concentration deficit following -- Frontal lobe and executive function deficit following -- Impact on safety and function Mild aspiration risk   CH[facility] IP TREATMENT RECOMMENDATION 04/24/2017 Treatment Recommendations Therapy as outlined  in treatment plan below   Prognosis 04/22/2017 Prognosis for Safe Diet Advancement Fair Barriers to Reach Goals Language deficits;Severity of deficits;Behavior Barriers/Prognosis Comment -- CH[facility] IP DIET RECOMMENDATION 04/24/2017 SLP Diet Recommendations Dysphagia 2 (Fine chop) solids;Nectar thick liquid Liquid Administration via Straw Medication Administration Whole meds with puree Compensations Slow rate;Small sips/bites;Lingual sweep for clearance of pocketing;Monitor for anterior loss;Follow solids with liquid Postural Changes Seated upright at 90 degrees   CHL IP OTHER RECOMMENDATIONS 04/24/2017 Recommended Consults -- Oral Care Recommendations Oral care before and after PO Other Recommendations Order thickener from pharmacy;Have oral suction available   CHL IP FOLLOW UP RECOMMENDATIONS 04/24/2017 Follow up Recommendations Home health SLP   CHL IP FREQUENCY AND DURATION 04/24/2017 Speech Therapy Frequency (ACUTE ONLY) min 2x/week Treatment Duration 2 weeks      CHL IP ORAL PHASE 04/24/2017 Oral Phase Impaired Oral - Pudding Teaspoon -- Oral - Pudding Cup -- Oral - Honey Teaspoon -- Oral - Honey Cup -- Oral - Nectar Teaspoon NT Oral - Nectar Cup Right anterior bolus loss Oral - Nectar Straw Other (Comment) Oral - Thin Teaspoon -- Oral - Thin Cup Right anterior bolus loss Oral - Thin Straw Decreased bolus cohesion;Other (Comment);Premature spillage Oral - Puree WFL Oral - Mech Soft Right pocketing in lateral sulci;Right anterior bolus loss Oral - Regular -- Oral - Multi-Consistency -- Oral - Pill -- Oral Phase - Comment --  CHL IP PHARYNGEAL PHASE 04/24/2017  Pharyngeal Phase Impaired Pharyngeal- Pudding Teaspoon -- Pharyngeal -- Pharyngeal- Pudding Cup -- Pharyngeal -- Pharyngeal- Honey Teaspoon -- Pharyngeal -- Pharyngeal- Honey Cup -- Pharyngeal -- Pharyngeal- Nectar Teaspoon -- Pharyngeal -- Pharyngeal- Nectar Cup Delayed swallow initiation-pyriform sinuses Pharyngeal -- Pharyngeal- Nectar Straw Delayed swallow initiation-pyriform sinuses Pharyngeal -- Pharyngeal- Thin Teaspoon -- Pharyngeal -- Pharyngeal- Thin Cup Delayed swallow initiation-pyriform sinuses Pharyngeal -- Pharyngeal- Thin Straw Delayed swallow initiation-pyriform sinuses;Penetration/Aspiration before swallow Pharyngeal -- Pharyngeal- Puree Delayed swallow initiation-vallecula;Delayed swallow initiation-pyriform sinuses Pharyngeal -- Pharyngeal- Mechanical Soft Delayed swallow initiation-vallecula Pharyngeal -- Pharyngeal- Regular -- Pharyngeal -- Pharyngeal- Multi-consistency -- Pharyngeal -- Pharyngeal- Pill -- Pharyngeal -- Pharyngeal Comment --  CHL IP CERVICAL ESOPHAGEAL PHASE 04/24/2017 Cervical Esophageal Phase Impaired Pudding Teaspoon -- Pudding Cup -- Honey Teaspoon -- Honey Cup -- Nectar Teaspoon -- Nectar Cup -- Nectar Straw -- Thin Teaspoon -- Thin Cup -- Thin Straw -- Puree -- Mechanical Soft -- Regular -- Multi-consistency -- Pill -- Cervical Esophageal Comment -- No flowsheet data found. DeBlois, Riley Nearing 04/24/2017, 2:59 PM              Dg Swallowing Func-speech Pathology  Result Date: 04/22/2017 Objective Swallowing Evaluation: Type of Study: MBS-Modified Barium Swallow Study  Patient Details Name: RAELYN RACETTE MRN: [id number] Date of Birth: 1924/06/01 Today's Date: 04/22/2017 Time: SLP Start Time (ACUTE ONLY): 1110 -SLP Stop Time (ACUTE ONLY): 1130 SLP Time Calculation (min) (ACUTE ONLY): 20 min Past Medical History: Past Medical History: Diagnosis Date . Aortic regurgitation 03/16/2015 . Apical variant hypertrophic cardiomyopathy (HCC) 03/16/2015 . Cataracts, bilateral  .  Cerebral aneurysm  . Hypercholesteremia  . Hypertension  . Osteoarthritis  . PAF (paroxysmal atrial fibrillation) (HCC) 03/23/2015 Past Surgical History: No past surgical history on file. HPI: Pete I Holleyis a 84 y.o.femalewith medical history significantfor hypertension, hyperlipidemia, atrial fibrillation on Eliquis, history of hypertrophic cardiomyopathy and aortic regurgitation who presented to our emergency department this morning with right-sided weakness and dysarthria. 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  Subjective: Pt seen in radiology for MBS to objectively assess swallow function and safety, and identify least restrictive diet. Pt awake, left gaze preference. No family present Assessment / Plan / Recommendation CHL IP CLINICAL IMPRESSIONS 04/22/2017 Clinical Impression Extremely limited study, due to pt lack of cooperation and severity of oral deficits. Multiple attempts to provide puree and nectar thick liquid consistencies were made, with pt refusing most presentations by turning her head from side to side and clamping her lips closed. In an effort to encourage participation with more functional presentations, a very small amount of nectar thick liquid was placed in left lateral sulcus via syringe,  however, the entire bolus was observed to spill anteriorly and no swallow reflex was elicited. The provision of puree and nectar thick liquids was attempted several times without success, so the study was terminated. Given severity of oral issues and overt indication of airway compromise documented on BSE, NPO status continues to be recommended at this time. ST will continue to follow to assess appropriateness for repeat study, or initiation of po diet.  SLP Visit Diagnosis Dysphagia, unspecified (R13.10) Impact on safety and function Severe aspiration risk;Risk for inadequate  nutrition/hydration   CHL IP TREATMENT RECOMMENDATION 04/22/2017 Treatment Recommendations Therapy as outlined in treatment plan below   Prognosis 04/22/2017 Prognosis for Safe Diet Advancement Fair Barriers to Reach Goals Language deficits;Severity of deficits;Behavior CHL IP DIET RECOMMENDATION 04/22/2017 SLP Diet Recommendations Alternative means - temporary;NPO Medication Administration Via alternative means   CHL IP OTHER RECOMMENDATIONS 04/22/2017 Oral Care Recommendations Oral care QID Other Recommendations Have oral suction available;Remove water pitcher   CHL IP FOLLOW UP RECOMMENDATIONS 04/22/2017 Follow up Recommendations 24 hour supervision/assistance;Skilled Nursing facility   [facility] IP FREQUENCY AND DURATION 04/22/2017 Speech Therapy Frequency (ACUTE ONLY) min 2x/week Treatment Duration 2 weeks      CHL IP ORAL PHASE 04/22/2017 Oral Phase Impaired Oral - Nectar Teaspoon Right anterior bolus loss;Decreased bolus cohesion;Reduced posterior propulsion;Holding of bolus Oral - Puree Right anterior bolus loss;Reduced posterior propulsion;Holding of bolus;Decreased bolus cohesion  CHL IP PHARYNGEAL PHASE 04/22/2017 Pharyngeal Phase No swallow reflex was elicited. Minimal bolus placed in oral cavity spilled out anteriorly and was not swallowed.  CHL IP CERVICAL ESOPHAGEAL PHASE 04/22/2017 Cervical Esophageal Phase No swallow reflex elicited Celia B. Murvin [facility], CCC-SLP Speech Language Pathologist [phone] Leigh Aurora 04/22/2017, 2:12 PM              Ct Head Code Stroke Wo Contrast  Result Date: 04/21/2017 CLINICAL DATA:  Code stroke.  Right-sided weakness.  Aphasia. EXAM: CT HEAD WITHOUT CONTRAST TECHNIQUE: Contiguous axial images were obtained from the base of the skull through the vertex without intravenous contrast. COMPARISON:  None. FINDINGS: Brain: There is no evidence of acute large territory infarct, intracranial hemorrhage, mass, midline shift, or extra-axial fluid collection. Age related cerebral  atrophy is noted. Periventricular white matter hypodensities are nonspecific but compatible with chronic small vessel ischemia, mild for age. Vascular: Calcified atherosclerosis at the skull base. 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 Skull: No fracture or focal osseous lesion. Sinuses/Orbits: Visualized paranasal sinuses and mastoid air cells are clear. No acute orbital findings. Other: None. ASPECTS [facility] Stroke Program Early CT Score) - Ganglionic level infarction (caudate, lentiform nuclei, internal capsule,  insula, M1-M3 cortex): 7 - Supraganglionic infarction (M4-M6 cortex): 3 Total score (0-10 with 10 being normal): 10 IMPRESSION: 1. No evidence of acute intracranial abnormality. 2. ASPECTS is 10. 3. Mild chronic small vessel ischemic disease. 4. Abnormal appearance of the right cavernous sinus region related to history of large ICA aneurysm status post remote balloon occlusion. These results were communicated to Dr. Amada Jupiter at 8:36 am on 04/21/2017 by text page via the [facility] messaging system. Electronically Signed   By: Sebastian Ache M.D.   On: 04/21/2017 08:40    Echo Study Conclusions  - Left ventricle: The cavity size was normal. Wall thickness was   increased in a pattern of mild LVH. Definity contrast reveals no   apical throbus - there is thicknening and a &quot;spade-shape&quot; of the   LV apex, suggestive of apical variant hypertrophic   cardiomyopathy. Systolic function was normal. The estimated   ejection fraction was in the range of 60% to 65%. Wall motion was   normal; there were no regional wall motion abnormalities. The   study is not technically sufficient to allow evaluation of LV   diastolic function. - Aortic valve: Sclerosis without stenosis.  There was moderate   regurgitation. - Aorta: Ascending aorta dimension: 3.9 cm. - Ascending aorta: The ascending aorta was mildly dilated. - Mitral valve: Calcified annulus. Mildly thickened leaflets .   There was trivial regurgitation. - Left atrium: Severely dilated. - Right atrium: The atrium was mildly dilated. - Tricuspid valve: There was mild regurgitation. - Pulmonary arteries: PA peak pressure: 36 mm Hg (S). - Inferior vena cava: The vessel was dilated. The respirophasic   diameter changes were blunted (< 50%), consistent with elevated   central venous pressure.  Impressions:  - Compared to a prior study in 2016, there are few changes. There   is moderate AI. There is evidence for apical hypertrophic   cardiomyopathy. The ascending aorta is dilated to 3.9 cm. The LA   is severely dialted.   Subjective: Patient seen and examined at bedside.  She hardly wakes up.  Does not answer any questions.  Spoke to daughter at bedside.  Discharge Exam: Vitals:   04/25/17 0445 04/25/17 0801  BP: (!) 176/86 (!) 153/75  Pulse: 69 71  Resp: 20 20  Temp: 97.6 F (36.4 C) 98.9 F (37.2 C)  SpO2: 96% 96%   Vitals:   04/24/17 2053 04/25/17 0034 04/25/17 0445 04/25/17 0801  BP: (!) 147/70 135/61 (!) 176/86 (!) 153/75  Pulse: (!) 58 (!) 59 69 71  Resp: 20 20 20 20   Temp: 98.8 F (37.1 C) 99 F (37.2 C) 97.6 F (36.4 C) 98.9 F (37.2 C)  TempSrc: Axillary Axillary Oral Oral  SpO2: 98% 99% 96% 96%  Weight:      Height:        General: Elderly ill-looking female, very drowsy.  Comfortable  cardiovascular: S1-S2 positive Respiratory: Lateral decreased breath sounds at bases    The results of significant diagnostics from this hospitalization (including imaging, microbiology, ancillary and laboratory) are listed below for reference.     Microbiology: No results found for this or any previous visit (from the past 240 hour(s)).   Labs: BNP (last 3 results) No results for  input(s): BNP in the last 8760 hours. Basic Metabolic Panel: Recent Labs  Lab 04/21/17 0813 04/21/17 0822  NA 139 142  K 3.7 3.6  CL 106 106  CO2 23  --   GLUCOSE 130* 127*  BUN 15  17  CREATININE 0.97 0.90  CALCIUM 8.9  --    Liver Function Tests: Recent Labs  Lab 04/21/17 0813  AST 25  ALT 10*  ALKPHOS 70  BILITOT 0.6  PROT 6.7  ALBUMIN 2.9*   No results for input(s): LIPASE, AMYLASE in the last 168 hours. No results for input(s): AMMONIA in the last 168 hours. CBC: Recent Labs  Lab 04/21/17 0813 04/21/17 0822  WBC 4.3  --   NEUTROABS 1.7  --   HGB 11.8* 12.2  HCT 36.2 36.0  MCV 96.3  --   PLT 208  --    Cardiac Enzymes: No results for input(s): CKTOTAL, CKMB, CKMBINDEX, TROPONINI in the last 168 hours. BNP: Invalid input(s): POCBNP CBG: Recent Labs  Lab 04/21/17 0810  GLUCAP 115*   D-Dimer No results for input(s): DDIMER in the last 72 hours. Hgb A1c No results for input(s): HGBA1C in the last 72 hours. Lipid Profile No results for input(s): CHOL, HDL, LDLCALC, TRIG, CHOLHDL, LDLDIRECT in the last 72 hours. Thyroid function studies No results for input(s): TSH, T4TOTAL, T3FREE, THYROIDAB in the last 72 hours.  Invalid input(s): FREET3 Anemia work up No results for input(s): VITAMINB12, FOLATE, FERRITIN, TIBC, IRON, RETICCTPCT in the last 72 hours. Urinalysis No results found for: COLORURINE, APPEARANCEUR, LABSPEC, PHURINE, GLUCOSEU, HGBUR, BILIRUBINUR, KETONESUR, PROTEINUR, UROBILINOGEN, NITRITE, LEUKOCYTESUR Sepsis Labs Invalid input(s): PROCALCITONIN,  WBC,  LACTICIDVEN Microbiology No results found for this or any previous visit (from the past 240 hour(s)).   Time coordinating discharge: 35 minutes  SIGNED:   Glade Lloyd, MD  [facility] 04/25/2017, 11:06 AM Pager: [phone]  If 7PM-7AM, please contact night-coverage [email or web address] Password [email or web address]

## 2017-04-25 NOTE — Progress Notes (Signed)
Patient ID: Courtney Reyes, female   DOB: 07/13/1924, 84 y.o.   MRN: [id number]  This NP visited patient at the bedside as a follow up to  yesterday's GOCs meeting.  Daughter Courtney Reyes at bedside.   Daughter tells me she "had a bad night", she was reaching and pulling at things.  We discussed underlying pain and terminal agitation.  Will schedule Roxanol every 6 hours.  Continued conversation regarding current medcial situation, natural trajectroy and expectations at EOL.    Focus of Care is comfort and dignity  -DNR/DNI -no artifical feeding or hydration now or in the future--patient has taken only a fews sips since           yesterdays afternoon -diet as tolerated with known risk for aspiration  -no further diagnostics or life prolonging interventions  Family is hopeful for residential hospice placement for end-of-life care, will write to offer choice. Prognosis is likely less than a few weeks with the focus being comfort, poor p.o. Intake, new stroke and high risk for rapid decompensation.   Discussed with family the importance of continued conversation with family and their  medical providers regarding overall plan of care and treatment options,  ensuring decisions are within the context of the patients values and GOCs.  Questions and concerns addressed    Time in  0730         Time out 0805   Total time spent on the unit was 35 minutes  Greater than 50% of the time was spent in counseling and coordination of care  Lorinda CreedMary Larach NP  Palliative Medicine Team Team Phone # [phone] Pager [phone]

## 2017-04-25 NOTE — Progress Notes (Signed)
PT Cancellation Note  Patient Details Name: Courtney HeritageMable I Petion MRN: [id number] DOB: 04/05/1924   Cancelled Treatment:    Reason Eval/Treat Not Completed: Other (comment).  I spoke with Lorinda CreedMary Larach, NP for palliative care and discussed continued therapy with Mx. Piatek.  She did really well participating and helping with EOB mobility 04/23/17 (see note) and did not seem to be in pain.  Mary recommended waiting until after the hospice consult and check back in to see if continued therapy is warranted.  PT to check back in with team tomorrow.  Thanks,    Rollene Rotundaebecca B. Shalay Carder, PT, DPT [phone]   04/25/2017, 8:34 AM

## 2017-04-25 NOTE — Progress Notes (Signed)
SLP Cancellation Note  Patient Details Name: Courtney Reyes MRN: [id number] DOB: 09/18/1924   Cancelled treatment:       Reason Eval/Treat Not Completed: Other (comment).  Checked in with pt and daughter, pt sleeping, was in pain last night.  Therapy orders again discontinued, please reorder if SLP interventions desired for interventions for swallowing/communication.    Daveigh Batty, Riley NearingBonnie Caroline 04/25/2017, 9:06 AM

## 2017-04-25 NOTE — Progress Notes (Signed)
CSW acknowledging consult for residential hospice placement. Per RN, [facility]. [facility]; [facility] has a bed and can take the patient today. [facility], patient's family is aware. CSW alerted MD.  Full assessment with family to follow. CSW will follow to facilitate transfer [facility] when ready.  Courtney NicelyElizabeth Jahel Reyes, Kentucky[place]CSW Clinical Social Worker [phone]

## 2017-04-25 NOTE — Consult Note (Signed)
[facility]  Received request from [place] for family interest in [facility]. Chart reviewed and met with daughter to complete paper work for transfer today.   Please send discharge summary to [phone].  RN please call report to [phone].  Thank you,  Erling Conte, LCSW [phone]

## 2017-04-25 NOTE — Progress Notes (Signed)
Report called in to [facility]. Family and patient updated.

## 2017-04-25 NOTE — Care Management Note (Signed)
Case Management Note  Patient Details  Name: Courtney Reyes MRN: [id number] Date of Birth: 09/14/1924  Subjective/Objective:                    Action/Plan: [facility]. No further needs per CM.   Expected Discharge Date:  04/25/17               Expected Discharge Plan:  Hospice Medical Facility  In-House Referral:  Clinical Social Work  Discharge planning Services     Post Acute Care Choice:    Choice offered to:     DME Arranged:    DME Agency:     HH Arranged:    HH Agency:     Status of Service:  Completed, signed off  If discussed at [employer] Meetings, dates discussed:    Additional Comments:  Kermit BaloKelli F Sufian Ravi, RN 04/25/2017, 11:46 AM

## 2017-04-25 NOTE — Clinical Social Work Note (Signed)
Clinical Social Work Assessment  Patient Details  Name: Courtney Reyes MRN: [id number] Date of Birth: 1924/12/06  Date of referral:  04/25/17               Reason for consult:  Facility Placement, End of Life/Hospice                Permission sought to share information with:  Facility Sport and exercise psychologist, Family Supports Permission granted to share information::  Yes, Verbal Permission Granted  Name::     Retail buyer::  [employer]  Relationship::  Daughter  Contact Information:     Housing/Transportation Living arrangements for the past 2 months:  Single Family Home Source of Information:  Adult Children Patient Interpreter Needed:  None Criminal Activity/Legal Involvement Pertinent to Current Situation/Hospitalization:  No - Comment as needed Significant Relationships:  Adult Children Lives with:  Self, Adult Children Do you feel safe going back to the place where you live?  Yes Need for family participation in patient care:  Yes (Comment)(patient is at end of life, not oriented)  Care giving concerns:  Patient is at end of life and family is requesting residential hospice.   Social Worker assessment / plan:  CSW alerted from PMT NP that patient's family is seeking residential hospice placement, and would like [employer]. CSW sent referral, and confirmed bed availability. CSW met with patient's daughter to confirm preference. CSW set up transport.   Employment status:  Retired Nurse, adult PT Recommendations:  Home with [place] / Referral to community resources:  Other (Comment Required)  Patient/Family's Response to care:  Patient's family agreeable to residential hospice placement.  Patient/Family's Understanding of and Emotional Response to Diagnosis, Current Treatment, and Prognosis:  Patient's family is aware that patient is at end of life, and while they were hopeful to take her home, they know that they can't provide the  care that she needs. Patient's family appreciative of care.  Emotional Assessment Appearance:  Appears stated age Attitude/Demeanor/Rapport:  Unable to Assess Affect (typically observed):  Unable to Assess Orientation:  Oriented to Self Alcohol / Substance use:  Not Applicable Psych involvement (Current and /or in the community):  No (Comment)  Discharge Needs  Concerns to be addressed:  Care Coordination Readmission within the last 30 days:  No Current discharge risk:  Terminally ill Barriers to Discharge:  No Barriers Identified   Geralynn Ochs, LCSW 04/25/2017, 12:18 PM

## 2017-04-25 NOTE — Progress Notes (Signed)
Patient sleeped peacefully throughout the night, daughter in room over night.

## 2017-04-25 NOTE — Progress Notes (Signed)
Patient was able to wake up and take medicine in applesauce and did laugh some when speaking to her.

## 2017-06-12 DEATH — deceased

## 2018-08-25 IMAGING — CR DG CHEST 1V PORT
1 series · 1 of 1 positions shown · non-contrast
Comparison: 04/21/2017

CLINICAL DATA: Short of breath

EXAM:
PORTABLE CHEST 1 VIEW

[AP]
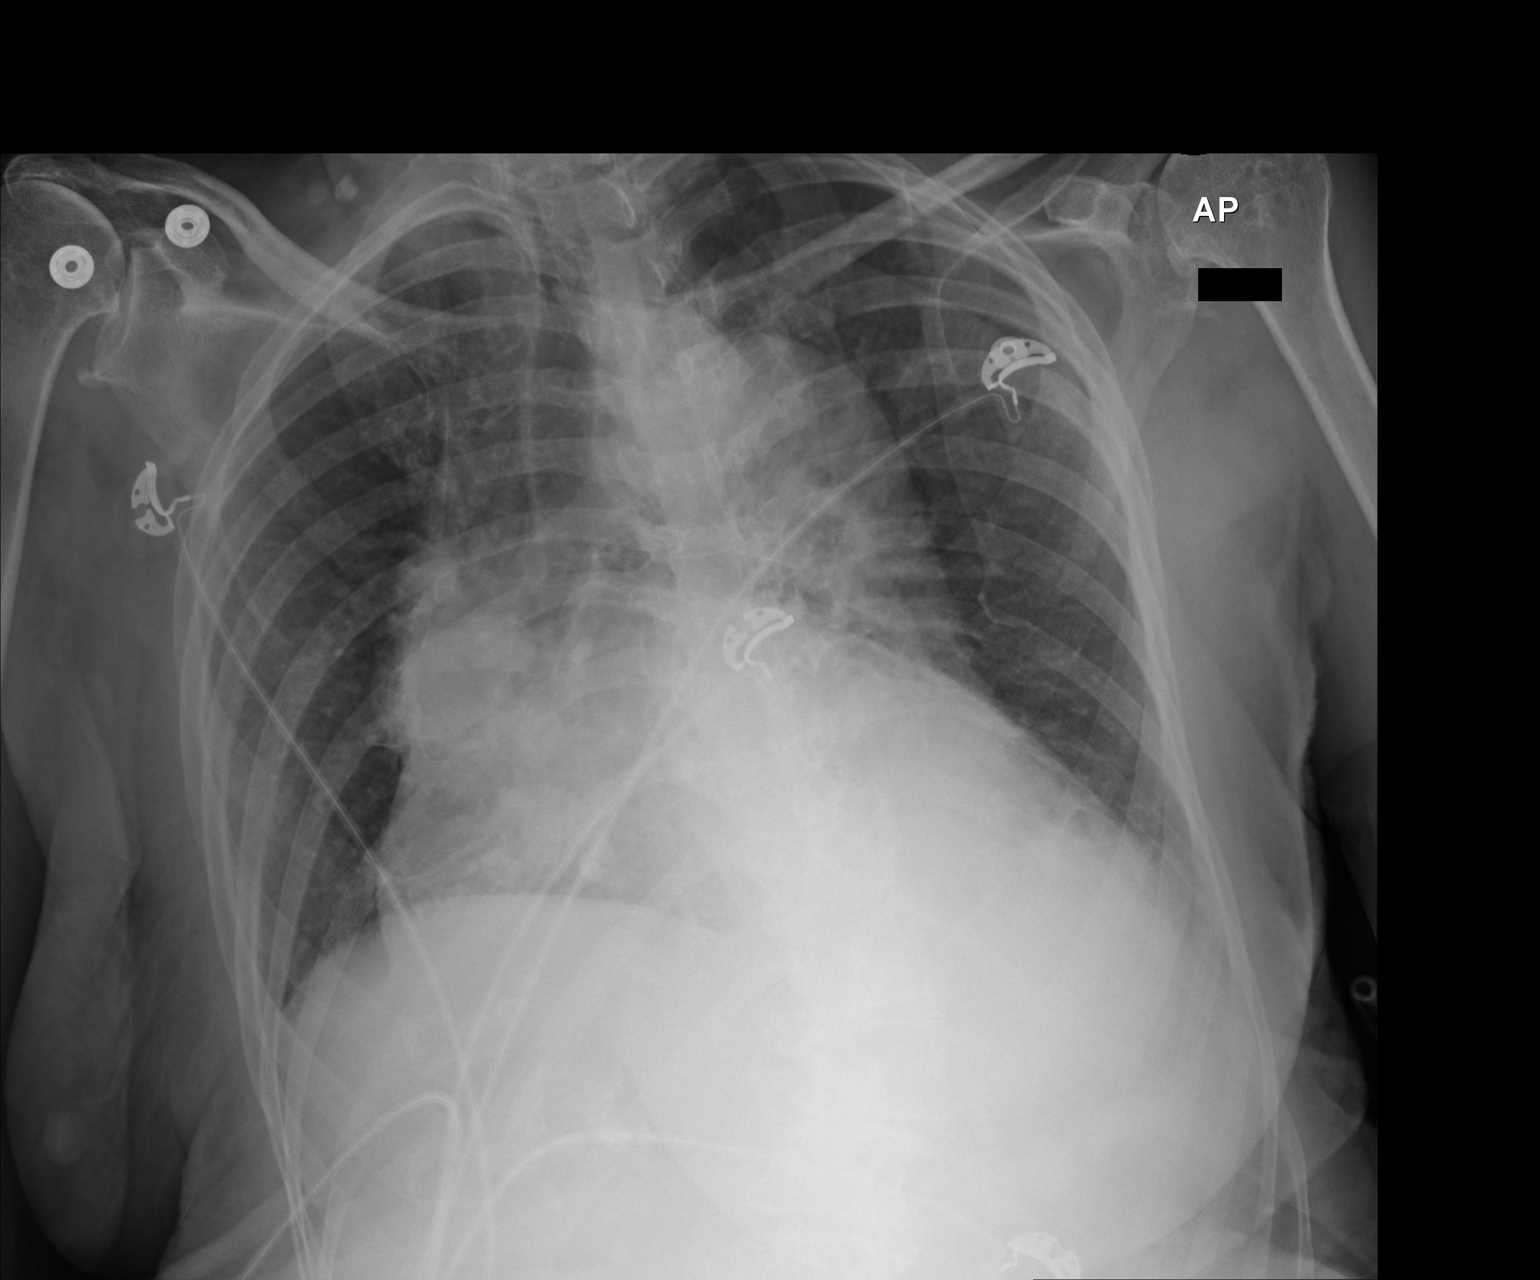

[1 of 1 positions shown; findings below may reference images not displayed]

FINDINGS: The heart remains severely enlarged. The thorax is rotated.
Evaluation of the mediastinum is limited. Prominence of the central
vessels is again noted. Peripheral pulmonary vascularity is within
normal limits. No pneumothorax. No pleural effusion. Lungs are
grossly clear allowing for rotation.
IMPRESSION: Stable cardiomegaly without decompensation.
